# Patient Record
Sex: Male | Born: 1965 | Race: Black or African American | Hispanic: No | Marital: Married | Smoking: Current every day smoker
Health system: Southern US, Community
[De-identification: ages and names within clinical notes are randomized; demographics above are authoritative.]

## PROBLEM LIST (undated history)

## (undated) DIAGNOSIS — E785 Hyperlipidemia, unspecified: Secondary | ICD-10-CM

## (undated) DIAGNOSIS — B20 Human immunodeficiency virus [HIV] disease: Secondary | ICD-10-CM

## (undated) HISTORY — PX: TONSILLECTOMY: SUR1361

## (undated) HISTORY — DX: Human immunodeficiency virus (HIV) disease: B20

## (undated) HISTORY — DX: Hyperlipidemia, unspecified: E78.5

---

## 2007-11-14 ENCOUNTER — Inpatient Hospital Stay (HOSPITAL_COMMUNITY): Admission: EM | Admit: 2007-11-14 | Discharge: 2007-12-13 | Payer: Self-pay | Admitting: Emergency Medicine

## 2007-11-14 ENCOUNTER — Ambulatory Visit: Payer: Self-pay | Admitting: Pulmonary Disease

## 2007-11-15 ENCOUNTER — Encounter: Payer: Self-pay | Admitting: Infectious Diseases

## 2007-11-16 ENCOUNTER — Ambulatory Visit: Payer: Self-pay | Admitting: Infectious Diseases

## 2007-11-17 ENCOUNTER — Encounter: Payer: Self-pay | Admitting: Internal Medicine

## 2007-12-09 ENCOUNTER — Encounter (INDEPENDENT_AMBULATORY_CARE_PROVIDER_SITE_OTHER): Payer: Self-pay | Admitting: *Deleted

## 2007-12-19 ENCOUNTER — Encounter (INDEPENDENT_AMBULATORY_CARE_PROVIDER_SITE_OTHER): Payer: Self-pay | Admitting: *Deleted

## 2008-01-01 ENCOUNTER — Ambulatory Visit: Payer: Self-pay | Admitting: Internal Medicine

## 2008-01-01 DIAGNOSIS — B20 Human immunodeficiency virus [HIV] disease: Secondary | ICD-10-CM

## 2008-01-01 DIAGNOSIS — B59 Pneumocystosis: Secondary | ICD-10-CM | POA: Insufficient documentation

## 2008-01-01 DIAGNOSIS — K029 Dental caries, unspecified: Secondary | ICD-10-CM | POA: Insufficient documentation

## 2008-01-01 HISTORY — DX: Human immunodeficiency virus (HIV) disease: B20

## 2008-01-01 LAB — CONVERTED CEMR LAB: GC Probe Amp, Urine: NEGATIVE

## 2008-01-30 ENCOUNTER — Ambulatory Visit: Payer: Self-pay | Admitting: Internal Medicine

## 2008-01-30 LAB — CONVERTED CEMR LAB
ALT: 91 units/L — ABNORMAL HIGH (ref 0–53)
AST: 60 units/L — ABNORMAL HIGH (ref 0–37)
Alkaline Phosphatase: 89 units/L (ref 39–117)
Basophils Absolute: 0 10*3/uL (ref 0.0–0.1)
CO2: 25 meq/L (ref 19–32)
Eosinophils Absolute: 0.1 10*3/uL (ref 0.0–0.7)
Eosinophils Relative: 2 % (ref 0–5)
HCT: 42.1 % (ref 39.0–52.0)
HIV 1 RNA Quant: 216 copies/mL — ABNORMAL HIGH (ref <48)
HIV-1 RNA Quant, Log: 2.33 — ABNORMAL HIGH (ref <1.68)
LDL Cholesterol: 64 mg/dL (ref 0–99)
Lymphocytes Relative: 43 % (ref 12–46)
Platelets: 275 10*3/uL (ref 150–400)
RDW: 18.6 % — ABNORMAL HIGH (ref 11.5–15.5)
Sodium: 141 meq/L (ref 135–145)
Total Bilirubin: 0.2 mg/dL — ABNORMAL LOW (ref 0.3–1.2)
Total Protein: 7.5 g/dL (ref 6.0–8.3)
VLDL: 60 mg/dL — ABNORMAL HIGH (ref 0–40)

## 2008-02-18 ENCOUNTER — Ambulatory Visit: Payer: Self-pay | Admitting: Internal Medicine

## 2008-02-19 ENCOUNTER — Encounter: Payer: Self-pay | Admitting: Internal Medicine

## 2008-02-20 ENCOUNTER — Encounter: Payer: Self-pay | Admitting: Infectious Diseases

## 2008-03-06 ENCOUNTER — Telehealth: Payer: Self-pay | Admitting: Internal Medicine

## 2008-03-17 ENCOUNTER — Telehealth (INDEPENDENT_AMBULATORY_CARE_PROVIDER_SITE_OTHER): Payer: Self-pay | Admitting: *Deleted

## 2008-04-07 ENCOUNTER — Encounter: Payer: Self-pay | Admitting: Internal Medicine

## 2008-04-27 ENCOUNTER — Telehealth (INDEPENDENT_AMBULATORY_CARE_PROVIDER_SITE_OTHER): Payer: Self-pay | Admitting: *Deleted

## 2008-05-18 ENCOUNTER — Ambulatory Visit: Payer: Self-pay | Admitting: Internal Medicine

## 2008-05-18 LAB — CONVERTED CEMR LAB
ALT: 21 units/L (ref 0–53)
Albumin: 4.2 g/dL (ref 3.5–5.2)
CO2: 18 meq/L — ABNORMAL LOW (ref 19–32)
Calcium: 8.9 mg/dL (ref 8.4–10.5)
Chloride: 106 meq/L (ref 96–112)
Cholesterol: 155 mg/dL (ref 0–200)
Creatinine, Ser: 0.76 mg/dL (ref 0.40–1.50)
Eosinophils Absolute: 0.1 10*3/uL (ref 0.0–0.7)
Eosinophils Relative: 2 % (ref 0–5)
HIV 1 RNA Quant: 64 copies/mL — ABNORMAL HIGH (ref <48)
HIV-1 RNA Quant, Log: 1.81 — ABNORMAL HIGH (ref <1.68)
Hemoglobin: 14.7 g/dL (ref 13.0–17.0)
Lymphocytes Relative: 32 % (ref 12–46)
Lymphs Abs: 2.4 10*3/uL (ref 0.7–4.0)
MCHC: 32.9 g/dL (ref 30.0–36.0)
MCV: 81.7 fL (ref 78.0–100.0)
Monocytes Relative: 6 % (ref 3–12)
Neutrophils Relative %: 60 % (ref 43–77)
Potassium: 3.8 meq/L (ref 3.5–5.3)
RBC: 5.47 M/uL (ref 4.22–5.81)
Total CHOL/HDL Ratio: 3.8

## 2008-05-21 ENCOUNTER — Telehealth (INDEPENDENT_AMBULATORY_CARE_PROVIDER_SITE_OTHER): Payer: Self-pay | Admitting: *Deleted

## 2008-06-02 ENCOUNTER — Ambulatory Visit: Payer: Self-pay | Admitting: Internal Medicine

## 2008-06-02 DIAGNOSIS — F17200 Nicotine dependence, unspecified, uncomplicated: Secondary | ICD-10-CM | POA: Insufficient documentation

## 2008-06-03 ENCOUNTER — Encounter (INDEPENDENT_AMBULATORY_CARE_PROVIDER_SITE_OTHER): Payer: Self-pay | Admitting: *Deleted

## 2008-06-22 ENCOUNTER — Telehealth (INDEPENDENT_AMBULATORY_CARE_PROVIDER_SITE_OTHER): Payer: Self-pay | Admitting: *Deleted

## 2008-07-16 ENCOUNTER — Telehealth (INDEPENDENT_AMBULATORY_CARE_PROVIDER_SITE_OTHER): Payer: Self-pay | Admitting: *Deleted

## 2008-08-13 ENCOUNTER — Telehealth (INDEPENDENT_AMBULATORY_CARE_PROVIDER_SITE_OTHER): Payer: Self-pay | Admitting: *Deleted

## 2008-09-15 ENCOUNTER — Telehealth (INDEPENDENT_AMBULATORY_CARE_PROVIDER_SITE_OTHER): Payer: Self-pay | Admitting: *Deleted

## 2008-10-01 ENCOUNTER — Ambulatory Visit: Payer: Self-pay | Admitting: Internal Medicine

## 2008-10-01 LAB — CONVERTED CEMR LAB
HIV 1 RNA Quant: <48 copies/mL (ref <48)
HIV-1 RNA Quant, Log: <1.68 (ref <1.68)

## 2008-10-12 ENCOUNTER — Telehealth: Payer: Self-pay | Admitting: Internal Medicine

## 2008-11-06 ENCOUNTER — Telehealth (INDEPENDENT_AMBULATORY_CARE_PROVIDER_SITE_OTHER): Payer: Self-pay | Admitting: *Deleted

## 2008-11-10 ENCOUNTER — Ambulatory Visit: Payer: Self-pay | Admitting: Internal Medicine

## 2008-12-09 ENCOUNTER — Telehealth (INDEPENDENT_AMBULATORY_CARE_PROVIDER_SITE_OTHER): Payer: Self-pay | Admitting: *Deleted

## 2009-01-06 ENCOUNTER — Telehealth (INDEPENDENT_AMBULATORY_CARE_PROVIDER_SITE_OTHER): Payer: Self-pay | Admitting: *Deleted

## 2009-02-05 ENCOUNTER — Telehealth (INDEPENDENT_AMBULATORY_CARE_PROVIDER_SITE_OTHER): Payer: Self-pay | Admitting: *Deleted

## 2009-03-18 ENCOUNTER — Telehealth (INDEPENDENT_AMBULATORY_CARE_PROVIDER_SITE_OTHER): Payer: Self-pay | Admitting: *Deleted

## 2009-04-05 ENCOUNTER — Telehealth (INDEPENDENT_AMBULATORY_CARE_PROVIDER_SITE_OTHER): Payer: Self-pay | Admitting: *Deleted

## 2009-04-06 IMAGING — CR DG CHEST 2V
2 series · 2 of 2 positions shown · non-contrast
Comparison: 11/19/2007

CLINICAL DATA: Shortness of breath.  Pneumonia.

CHEST - 2 VIEW

[w chest pa]
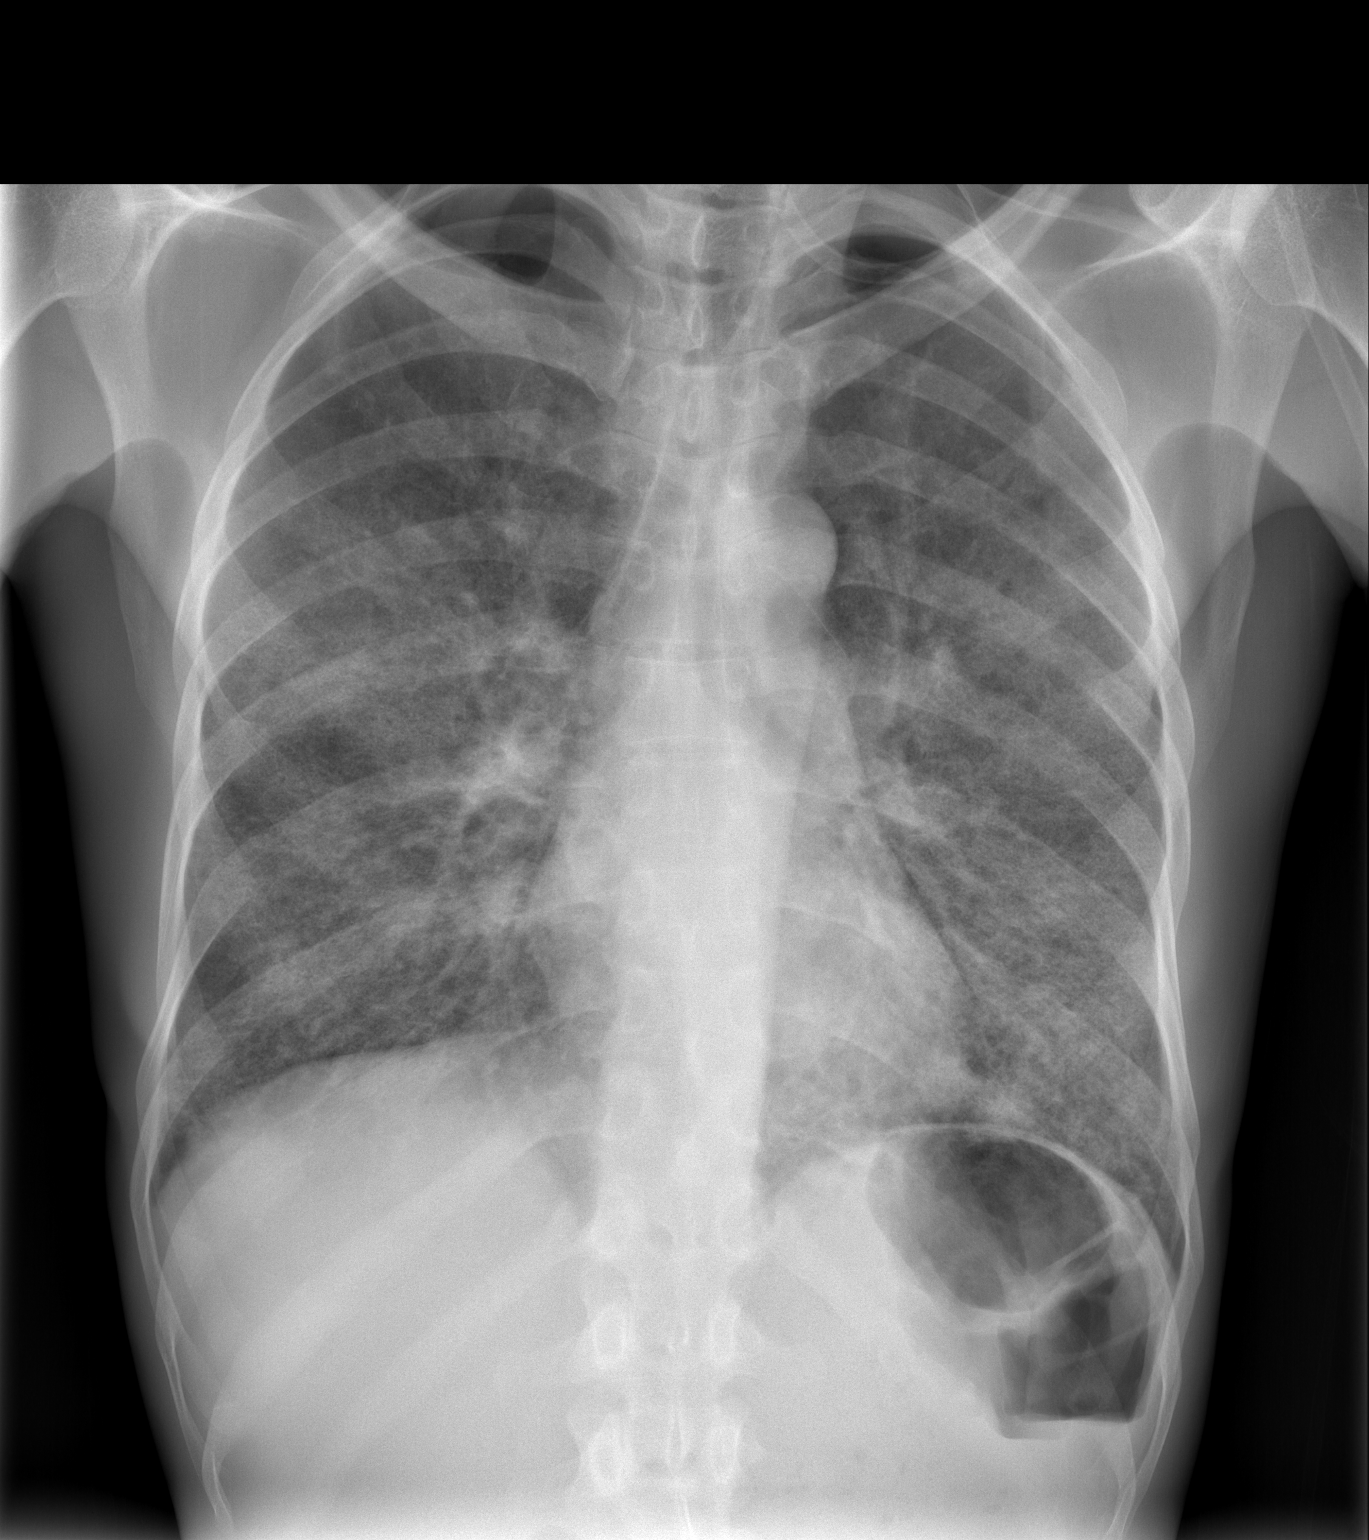

[w chest lat]
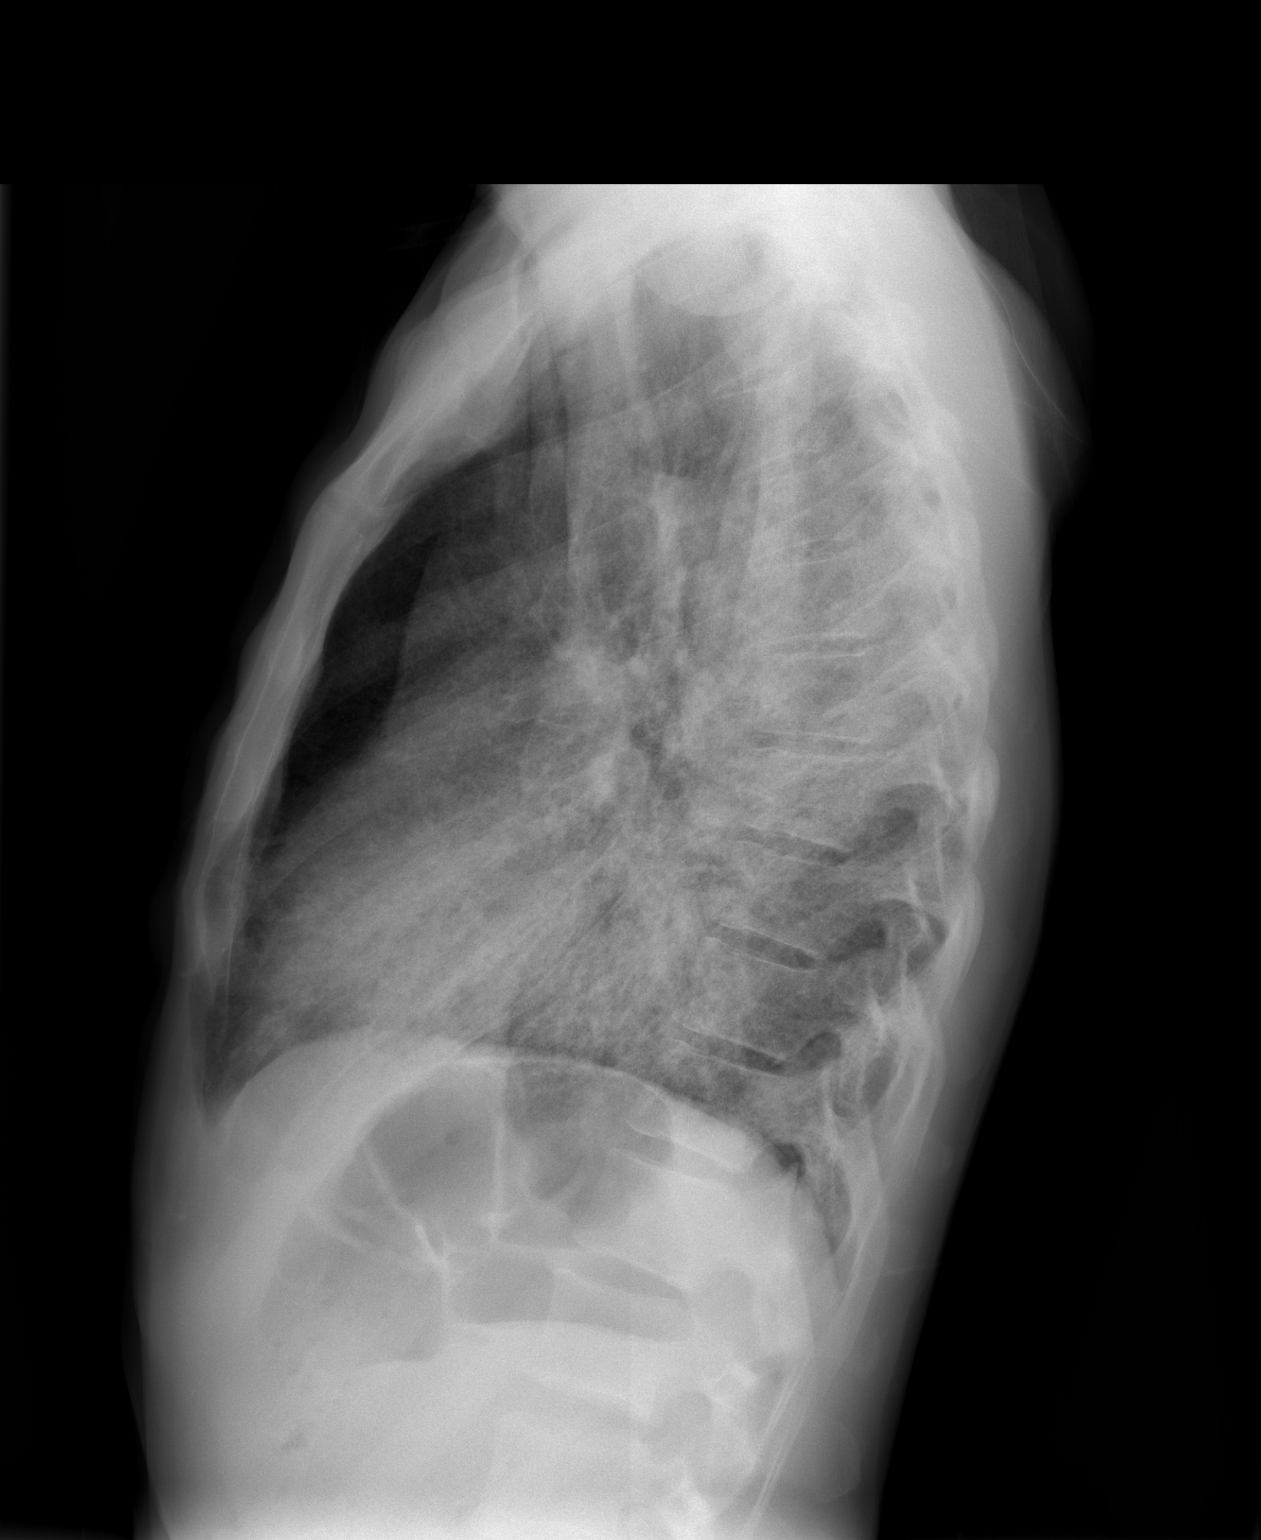

[2 of 2 positions shown; findings below may reference images not displayed]

FINDINGS: Bilateral airspace opacities have slightly improved since
the prior study.
The cardiomediastinal silhouette is unremarkable.
No evidence of pleural effusions or pneumothorax noted.
No acute bony abnormalities are present.
IMPRESSION: Slight improvement of diffuse bilateral airspace opacities.

## 2009-04-07 ENCOUNTER — Ambulatory Visit: Payer: Self-pay | Admitting: Internal Medicine

## 2009-04-07 LAB — CONVERTED CEMR LAB
ALT: 16 units/L (ref 0–53)
AST: 19 units/L (ref 0–37)
Albumin: 4.4 g/dL (ref 3.5–5.2)
Alkaline Phosphatase: 108 units/L (ref 39–117)
BUN: 12 mg/dL (ref 6–23)
Basophils Absolute: 0 10*3/uL (ref 0.0–0.1)
Calcium: 9.3 mg/dL (ref 8.4–10.5)
Chloride: 108 meq/L (ref 96–112)
HCT: 44.6 % (ref 39.0–52.0)
HDL: 47 mg/dL (ref >39)
HIV 1 RNA Quant: 123 copies/mL — ABNORMAL HIGH (ref <48)
LDL Cholesterol: 90 mg/dL (ref 0–99)
Lymphocytes Relative: 48 % — ABNORMAL HIGH (ref 12–46)
Lymphs Abs: 2.9 10*3/uL (ref 0.7–4.0)
Neutro Abs: 2.5 10*3/uL (ref 1.7–7.7)
Neutrophils Relative %: 41 % — ABNORMAL LOW (ref 43–77)
Platelets: 195 10*3/uL (ref 150–400)
Potassium: 3.7 meq/L (ref 3.5–5.3)
RDW: 15 % (ref 11.5–15.5)
Sodium: 140 meq/L (ref 135–145)
WBC: 5.9 10*3/uL (ref 4.0–10.5)

## 2009-04-22 ENCOUNTER — Ambulatory Visit: Payer: Self-pay | Admitting: Internal Medicine

## 2009-04-22 IMAGING — CR DG CHEST 2V
2 series · 2 of 2 positions shown · non-contrast
Comparison: None [DATE] and 11/14/2007

CLINICAL DATA: Pneumonia, hypoxia.

CHEST - 2 VIEW

[w chest pa]
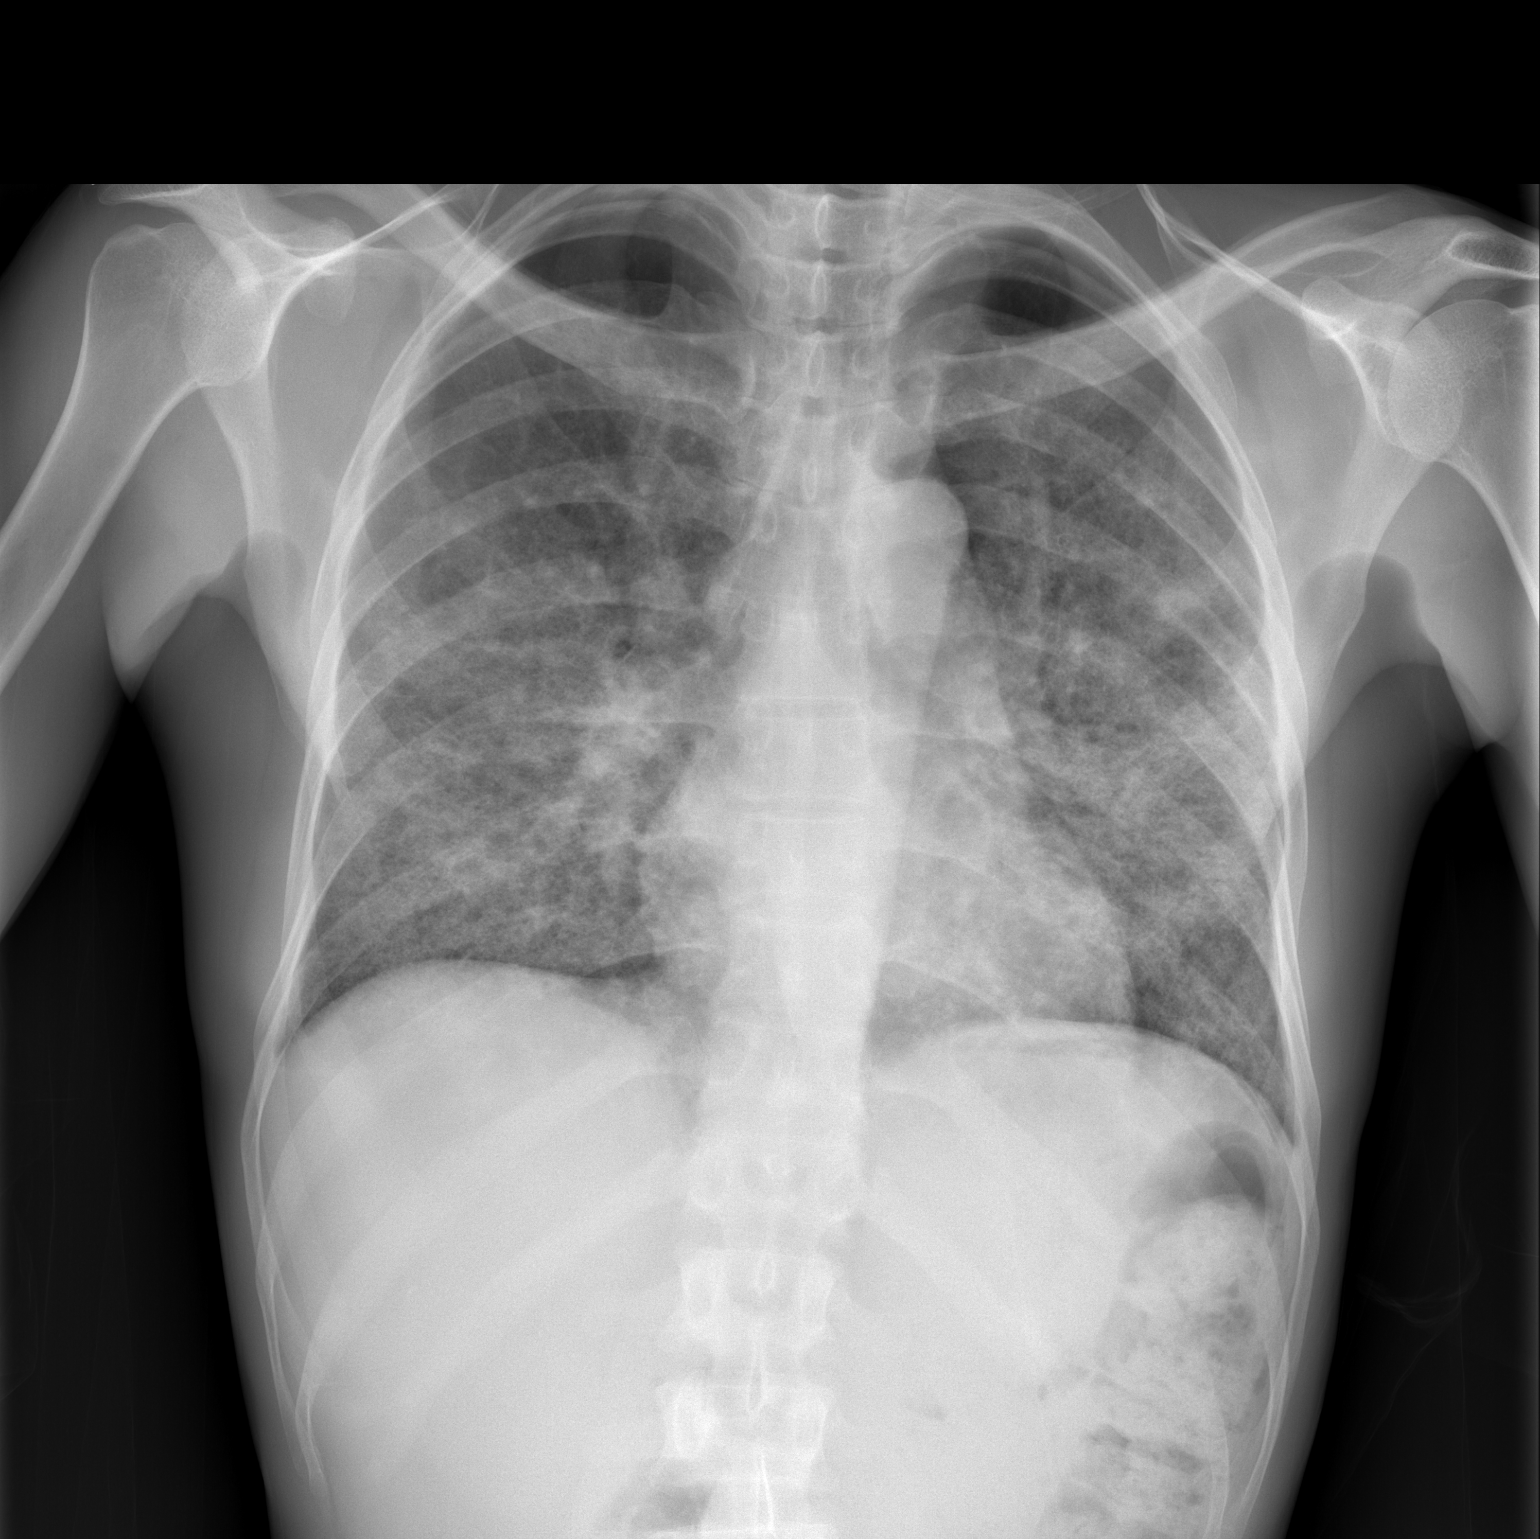

[w chest lat]
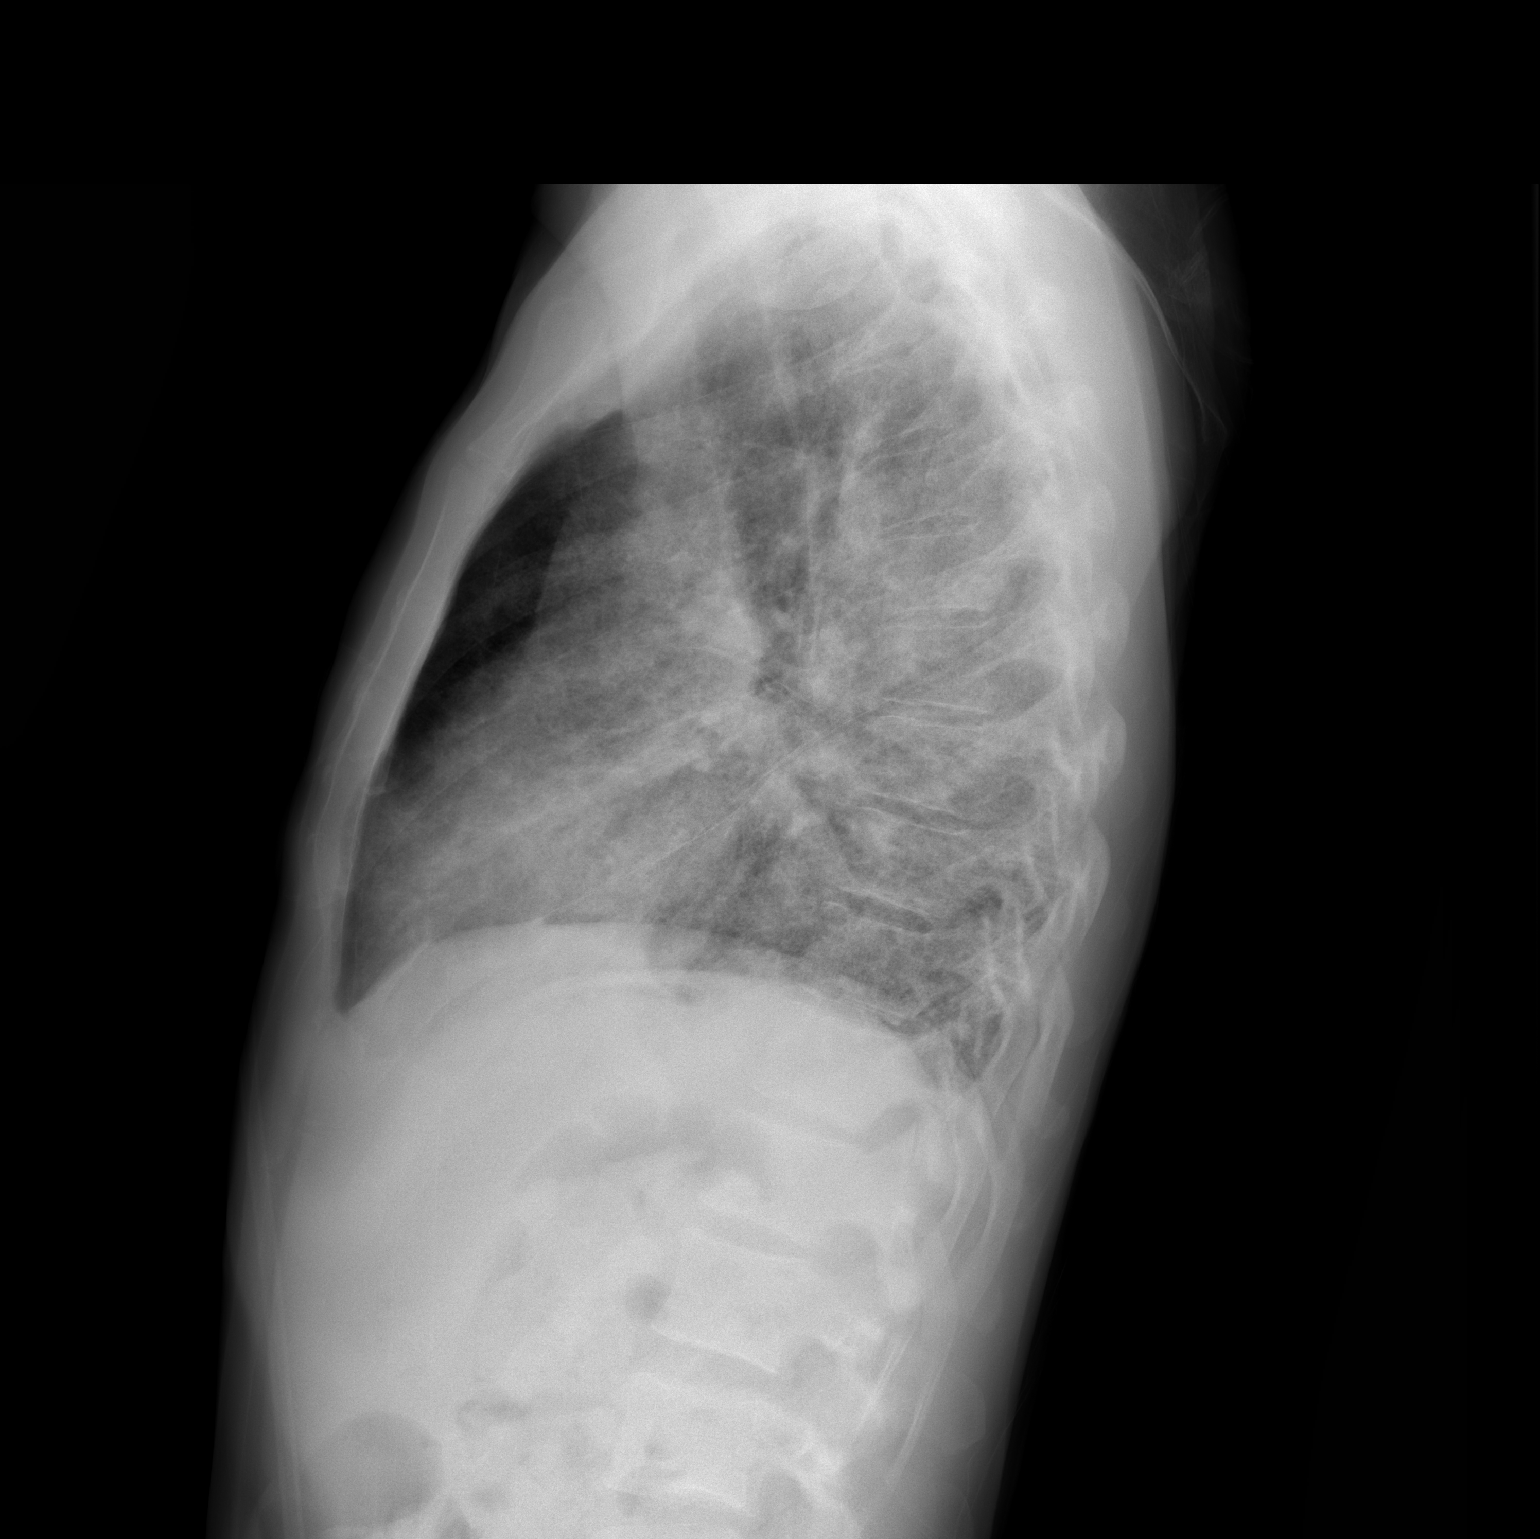

[2 of 2 positions shown; findings below may reference images not displayed]

FINDINGS: Trachea is midline.  Heart size normal.  Patchy bilateral
air space disease persists.  No pleural fluid.
IMPRESSION: Persistent bilateral air space disease, without interval change
over several prior exams.  CT chest may be helpful in further
evaluation, as clinically indicated.

## 2009-05-04 ENCOUNTER — Telehealth (INDEPENDENT_AMBULATORY_CARE_PROVIDER_SITE_OTHER): Payer: Self-pay | Admitting: *Deleted

## 2009-05-31 ENCOUNTER — Telehealth (INDEPENDENT_AMBULATORY_CARE_PROVIDER_SITE_OTHER): Payer: Self-pay | Admitting: *Deleted

## 2009-06-30 ENCOUNTER — Telehealth (INDEPENDENT_AMBULATORY_CARE_PROVIDER_SITE_OTHER): Payer: Self-pay | Admitting: *Deleted

## 2009-07-23 ENCOUNTER — Telehealth (INDEPENDENT_AMBULATORY_CARE_PROVIDER_SITE_OTHER): Payer: Self-pay | Admitting: *Deleted

## 2009-11-08 ENCOUNTER — Telehealth (INDEPENDENT_AMBULATORY_CARE_PROVIDER_SITE_OTHER): Payer: Self-pay | Admitting: *Deleted

## 2009-11-08 ENCOUNTER — Ambulatory Visit: Payer: Self-pay | Admitting: Internal Medicine

## 2009-11-08 LAB — CONVERTED CEMR LAB
HIV 1 RNA Quant: <48 copies/mL (ref <48)
HIV-1 RNA Quant, Log: <1.68 (ref <1.68)

## 2009-11-09 ENCOUNTER — Telehealth (INDEPENDENT_AMBULATORY_CARE_PROVIDER_SITE_OTHER): Payer: Self-pay | Admitting: *Deleted

## 2009-11-23 ENCOUNTER — Ambulatory Visit: Payer: Self-pay | Admitting: Internal Medicine

## 2009-12-31 ENCOUNTER — Telehealth (INDEPENDENT_AMBULATORY_CARE_PROVIDER_SITE_OTHER): Payer: Self-pay | Admitting: *Deleted

## 2010-01-04 ENCOUNTER — Telehealth (INDEPENDENT_AMBULATORY_CARE_PROVIDER_SITE_OTHER): Payer: Self-pay | Admitting: *Deleted

## 2010-01-11 ENCOUNTER — Encounter: Payer: Self-pay | Admitting: Internal Medicine

## 2010-02-03 ENCOUNTER — Telehealth (INDEPENDENT_AMBULATORY_CARE_PROVIDER_SITE_OTHER): Payer: Self-pay | Admitting: *Deleted

## 2010-02-15 ENCOUNTER — Encounter (INDEPENDENT_AMBULATORY_CARE_PROVIDER_SITE_OTHER): Payer: Self-pay | Admitting: *Deleted

## 2010-04-19 NOTE — Progress Notes (Signed)
Summary: Pt. assist meds arrived for Jan via [facility] mail order  Phone Note Refill Request      Prescriptions: SEPTRA DS 800-160 MG TABS (SULFAMETHOXAZOLE-TRIMETHOPRIM) Take 1 tablet by mouth once a day  #30 x 0   Entered by:   Paulo Fruit  BS,CPht II,MPH   Authorized by:   Cliffton Asters MD   Signed by:   Paulo Fruit  BS,CPht II,MPH on 04/05/2009   Method used:   Samples Given   RxID:   [id number] ATRIPLA 600-200-300 MG TABS (EFAVIRENZ-EMTRICITAB-TENOFOVIR) Take 1 tablet by mouth once a day  #30 x 0   Entered by:   Paulo Fruit  BS,CPht II,MPH   Authorized by:   Cliffton Asters MD   Signed by:   Paulo Fruit  BS,CPht II,MPH on 04/05/2009   Method used:   Samples Given   RxID:   [id number]  Patient Assist Medication Verification: Medication name: SMZ-TMP DS 800/160mg  RX #  [id number] Tech approval:MLD   Patient Assist Medication Verification: Medication:Atripla Lot# [id number] Exp Date:06 2013 Tech approval:MLD Call placed to patient with message that assistance medications are ready for pick-up. Paulo Fruit  BS,CPht II,MPH  April 05, 2009 9:23 AM                  Appended Document: Pt. assist meds arrived for Jan via [facility] mail order Prescription/Samples picked up by: patient

## 2010-04-19 NOTE — Progress Notes (Signed)
Summary: meds arrived via mail [facility]  Phone Note Refill Request      Prescriptions: ATRIPLA 600-200-300 MG TABS (EFAVIRENZ-EMTRICITAB-TENOFOVIR) Take 1 tablet by mouth once a day  #30 x 0   Entered by:   Paulo Fruit  BS,CPht II,MPH   Authorized by:   Cliffton Asters MD   Signed by:   Paulo Fruit  BS,CPht II,MPH on 11/09/2009   Method used:   Samples Given   RxID:   [id number]  Patient Assist Medication Verification: Medication name: Atripla RX # [id number] Tech approval:MLD Call placed to patient with message that assistance medications are ready for pick-up. Paulo Fruit  BS,CPht II,MPH  November 09, 2009 3:29 PM

## 2010-04-19 NOTE — Progress Notes (Signed)
Summary: Pt assist med arrived via [facility] for Mar  Phone Note Refill Request      Prescriptions: ATRIPLA 600-200-300 MG TABS (EFAVIRENZ-EMTRICITAB-TENOFOVIR) Take 1 tablet by mouth once a day  #30 x 0   Entered by:   Paulo Fruit  BS,CPht II,MPH   Authorized by:   Cliffton Asters MD   Signed by:   Paulo Fruit  BS,CPht II,MPH on 05/31/2009   Method used:   Samples Given   RxID:   [id number]   Patient Assist Medication Verification: Medication: Atripla FAO#[id number] Exp Date:07 2013 Tech approval:MLD Call placed to patient with message that assistance medications are ready for pick-up. Paulo Fruit  BS,CPht II,MPH  May 31, 2009 3:31 PM

## 2010-04-19 NOTE — Progress Notes (Signed)
Summary: [facility]/pt assist med arrived for May  Phone Note Refill Request      Prescriptions: ATRIPLA 600-200-300 MG TABS (EFAVIRENZ-EMTRICITAB-TENOFOVIR) Take 1 tablet by mouth once a day  #30 x 0   Entered by:   Paulo Fruit  BS,CPht II,MPH   Authorized by:   Cliffton Asters MD   Signed by:   Paulo Fruit  BS,CPht II,MPH on 07/23/2009   Method used:   Samples Given   RxID:   [id number]   Patient Assist Medication Verification: Medication: Atripla Lot# [id number] Exp Date:08 2013 Tech approval:MLD Call placed to patient with message that assistance medications are ready for pick-up. Paulo Fruit  BS,CPht II,MPH  Jul 23, 2009 10:03 AM

## 2010-04-19 NOTE — Miscellaneous (Signed)
Summary: Orders Update  Clinical Lists Changes  Orders: Added new Referral order of Misc. Referral (Misc. Ref) - Signed 

## 2010-04-19 NOTE — Progress Notes (Signed)
Summary: Atripla   Phone Note Outgoing Call   Summary of Call: Atriplla Medication(s) have arrived.  Left message or spoke to patient advising them they were ready for pickup.   Initial call taken by: Altamease Oiler,  December 31, 2009 8:36 AM

## 2010-04-19 NOTE — Progress Notes (Signed)
Summary: Pt assist med arrived via [facility] for Apr  Phone Note Refill Request      Prescriptions: ATRIPLA 600-200-300 MG TABS (EFAVIRENZ-EMTRICITAB-TENOFOVIR) Take 1 tablet by mouth once a day  #30 x 0   Entered by:   Paulo Fruit  BS,CPht II,MPH   Authorized by:   Cliffton Asters MD   Signed by:   Paulo Fruit  BS,CPht II,MPH on 06/30/2009   Method used:   Samples Given   RxID:   [id number]   Patient Assist Medication Verification: Medication: William [name] Lot# [id number] Exp Date:07 2013 Tech approval:MLD Call placed to patient with message that assistance medications are ready for pick-up. Paulo Fruit  BS,CPht II,MPH  June 30, 2009 4:45 PM

## 2010-04-19 NOTE — Assessment & Plan Note (Signed)
Summary: 6 MONTH RECHECK/CH   CC:  follow-up visit.  History of Present Illness: William [name] is in for his routine visit.  He has no trouble obtaining or tolerating his medications and denies missing a single dose of Atripla. He says he has been feeling much better over the last year.  He is not in a relationship and has not been sexually active.  Preventive Screening-Counseling & Management  Alcohol-Tobacco     Alcohol drinks/day: 0     Smoking Status: current     Smoking Cessation Counseling: yes     Smoke Cessation Stage: contemplative     Packs/Day: 0.5     Year Started: 30yrs.     Year Quit: 2009     Passive Smoke Exposure: no  Caffeine-Diet-Exercise     Caffeine use/day: YES     Does Patient Exercise: no     Type of exercise: walking     Exercise (avg: min/session): 30-60     Times/week: 5  Hep-HIV-STD-Contraception     HIV Risk: no risk noted  Safety-Violence-Falls     Seat Belt Use: yes  Comments: declined condoms      Sexual History:  n/a.        Drug Use:  never.     Prior Medication List:  ATRIPLA 600-200-300 MG TABS (EFAVIRENZ-EMTRICITAB-TENOFOVIR) Take 1 tablet by mouth once a day SEPTRA DS 800-160 MG TABS (SULFAMETHOXAZOLE-TRIMETHOPRIM) Take 1 tablet by mouth once a day   Current Allergies (reviewed today): No known allergies  Social History: Sexual History:  n/a  Vital Signs:  Patient profile:   45 year old male Height:      71 inches (180.34 cm) Weight:      147.3 pounds (66.95 kg) BMI:     20.62 Temp:     97.0 degrees F (36.11 degrees C) oral Pulse rate:   65 / minute BP sitting:   120 / 79  (left arm) Cuff size:   regular  Vitals Entered By: Jennet Maduro RN (April 22, 2009 10:49 AM) CC: follow-up visit Is Patient Diabetic? No Pain Assessment Patient in pain? no      Nutritional Status BMI of 19 -24 = normal Nutritional Status Detail appetite "GOOD"  Have you ever been in a relationship where you felt threatened, hurt or  afraid?No   Does patient need assistance? Functional Status Self care Ambulation Normal Comments no missed doses of rxes   Physical Exam  General:  alert and well-nourished.   Mouth:  pharynx pink and moist, no erythema, no exudates, and fair dentition.   Lungs:  normal breath sounds.  no crackles and no wheezes.   Heart:  normal rate, regular rhythm, and no murmur.   Skin:  no rashes.   Psych:  normally interactive, good eye contact, not anxious appearing, and not depressed appearing.          Medication Adherence: 04/22/2009   Adherence to medications reviewed with patient. Counseling to provide adequate adherence provided   Prevention For Positives: 04/22/2009   Safe sex practices discussed with patient. Condoms offered.                             Impression & Recommendations:  Problem # 1:  HIV DISEASE (ICD-042) [name]'s HIV infection is under much better control over the last year and a half.  I will not make any changes today other than stopping his PCP prophylaxis. The following medications were  removed from the medication list:    Septra Ds 800-160 Mg Tabs (Sulfamethoxazole-trimethoprim) .Marland Kitchen... 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 Patient Level III (65784) Future Orders: T-CD4SP ([facility]) (CD4SP) ... 10/19/2009 T-HIV Viral Load [phone] ... 10/19/2009  Patient Instructions: 1)  Please schedule a follow-up appointment in 6 months.   Process Orders Check Orders Results:     [facility]: ABN not required for this insurance Tests Sent for requisitioning (April 22, 2009 11:03 AM):     10/19/2009: [facility] -- T-HIV Viral Load  [phone] (signed)         Medication Adherence: 04/22/2009   Adherence to medications reviewed with patient. Counseling to provide adequate adherence provided    Prevention For Positives: 04/22/2009   Safe sex practices discussed with patient. Condoms offered.                            Appended Document: vaccines Flu Vaccine Consent Questions     Do you have a history of severe allergic reactions to this vaccine? no    Any prior history of allergic reactions to egg and/or gelatin? no    Do you have a sensitivity to the preservative Thimersol? no    Do you have a past history of Guillan-Barre Syndrome? no    Do you currently have an acute febrile illness? no    Have you ever had a severe reaction to latex? no    Vaccine information given and explained to patient? yes    Are you currently pregnant? no    Lot [id number]   Exp Date:04//2011   Manufacturer: [employer]    Site Given  Left Deltoid IM Jennet Maduro RN  April 22, 2009 11:09 AM   Hepatitis B Vaccine # 3    Vaccine Type: HepB Adult    Site: right deltoid    Mfr: Merck    Dose: 0.5 ml    Route: IM    Given by: Jennet Maduro RN    Exp. Date: 02/20/2011    Lot #: [id number]

## 2010-04-19 NOTE — Progress Notes (Signed)
Summary: Pt assist med arrivef via [facility] for Feb  Phone Note Refill Request      Prescriptions: ATRIPLA 600-200-300 MG TABS (EFAVIRENZ-EMTRICITAB-TENOFOVIR) Take 1 tablet by mouth once a day  #30 x 0   Entered by:   Paulo Fruit  BS,CPht II,MPH   Authorized by:   Cliffton Asters MD   Signed by:   Paulo Fruit  BS,CPht II,MPH on 05/04/2009   Method used:   Samples Given   RxID:   [id number]   Patient Assist Medication Verification: Medication:  Atripla Lot# [id number] Exp Date:06 2012 Tech approval:MLD Call placed to patient with message that assistance medications are ready for pick-up. Paulo Fruit  BS,CPht II,MPH  May 04, 2009 4:46 PM

## 2010-04-19 NOTE — Progress Notes (Signed)
Summary: new mail order pharmacy  Phone Note Call from Patient   Caller: Patient  labs Summary of Call: Patient came to pick up medications while at lab appt.  Sadly to say, I did not have any medication to give him.  I called to see if he was transfered to another pharmacy. I spoke to someone at [facility] who did state that he was transfered to [facility] in  [place] through the buy out.  They have not filled anything since May.  But partient had medications here for months so he was not out.  I called [facility] and they had patient in system with no prescriptions.  I gave the pharmacy a new prescription for patient and we will receive his medication on Tuesday, 11/09/09.  Pt. aware to come after 11am Initial call taken by: Paulo Fruit  BS,CPht II,MPH,  November 08, 2009 9:19 AM    Prescriptions: ATRIPLA 600-200-300 MG TABS (EFAVIRENZ-EMTRICITAB-TENOFOVIR) Take 1 tablet by mouth once a day  #30 x 11   Entered by:   Paulo Fruit  BS,CPht II,MPH   Authorized by:   Cliffton Asters MD   Signed by:   Paulo Fruit  BS,CPht II,MPH on 11/08/2009   Method used:   Telephoned to ...         RxID:   [id number]  spoke to RPh who read back the order. [phone] Paulo Fruit  BS,CPht II,MPH  November 08, 2009 9:21 AM

## 2010-04-19 NOTE — Progress Notes (Signed)
Summary: Pt. called to pick-up rx  Phone Note Outgoing Call   Call placed by: Jennet Maduro RN,  February 03, 2010 1:23 PM Call placed to: Patient Action Taken: Assistance medications ready for pick up Summary of Call: [facility] Atripla rx received.  Pt informed to pick up rx. Jennet Maduro RN  February 03, 2010 1:25 PM     New/Updated Medications: ATRIPLA 600-200-300 MG TABS (EFAVIRENZ-EMTRICITAB-TENOFOVIR) Take 1 tablet by mouth at bedtime daily Prescriptions: ATRIPLA 600-200-300 MG TABS (EFAVIRENZ-EMTRICITAB-TENOFOVIR) Take 1 tablet by mouth at bedtime daily  #30 x 11   Entered by:   Jennet Maduro RN   Authorized by:   Cliffton Asters MD   Signed by:   Jennet Maduro RN on 02/03/2010   Method used:   [phone]       [address]       [place], [place]  [address]       Ph: [phone]       Fax: [phone]   RxID:   [id number]  [facility]. Jennet Maduro RN  February 03, 2010 1:30 PM

## 2010-04-19 NOTE — Progress Notes (Signed)
Summary: Patient picked up medicine  Phone Note Other Incoming   Caller: Patient Summary of Call: Patient came in to pick up medicine on 01-04-10.

## 2010-04-19 NOTE — Miscellaneous (Signed)
  Clinical Lists Changes 

## 2010-04-19 NOTE — Assessment & Plan Note (Signed)
Summary: F/U [MKJ]   CC:  follow-up visit.  History of Present Illness: William [name] is in for his routine visit.  He does not recall missing a single dose of his Atripla. He is not in any relationship currently and has not been sexually active.  Preventive Screening-Counseling & Management  Alcohol-Tobacco     Alcohol drinks/day: 0     Smoking Status: current     Smoking Cessation Counseling: yes     Smoke Cessation Stage: contemplative     Packs/Day: 0.5     Year Started: 46yrs.     Year Quit: 2009     Passive Smoke Exposure: no  Caffeine-Diet-Exercise     Caffeine use/day: YES     Does Patient Exercise: yes     Type of exercise: walking     Exercise (avg: min/session): 30-60     Times/week: 3  Hep-HIV-STD-Contraception     HIV Risk: no risk noted  Safety-Violence-Falls     Seat Belt Use: yes  Comments: declined condoms      Sexual History:  n/a.        Drug Use:  former.     Prior Medication List:  ATRIPLA 600-200-300 MG TABS (EFAVIRENZ-EMTRICITAB-TENOFOVIR) Take 1 tablet by mouth once a day   Current Allergies (reviewed today): No known allergies  Social History: Drug Use:  former  Vital Signs:  Patient profile:   45 year old male Height:      71 inches (180.34 cm) Weight:      149.5 pounds (67.95 kg) BMI:     20.93 Temp:     98.2 degrees F (36.78 degrees C) oral Pulse rate:   77 / minute BP sitting:   122 / 77  (left arm) Cuff size:   regular  Vitals Entered By: Jennet Maduro RN (November 23, 2009 8:55 AM) CC: follow-up visit Is Patient Diabetic? No Pain Assessment Patient in pain? no      Nutritional Status BMI of 19 -24 = normal Nutritional Status Detail appetite "fine"  Have you ever been in a relationship where you felt threatened, hurt or afraid?No   Does patient need assistance? Functional Status Self care Ambulation Normal Comments no missed doses of rxes   Physical Exam  General:  alert and well-nourished.   Mouth:   pharynx pink and moist, no erythema, no exudates, and fair dentition.   Lungs:  normal breath sounds.  no crackles and no wheezes.   Heart:  normal rate, regular rhythm, and no murmur.           Medication Adherence: 11/23/2009   Adherence to medications reviewed with patient. Counseling to provide adequate adherence provided   Prevention For Positives: 11/23/2009   Safe sex practices discussed with patient. Condoms offered.                             Impression & Recommendations:  Problem # 1:  HIV DISEASE (ICD-042) Cannen's HIV infection has come under much better control over the past two years due to his excellent adherence.  I will not make any changes today. 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 Patient Level III (54098) Future Orders: T-CD4SP (  [facility]) (CD4SP) ... 05/22/2010 T-HIV Viral Load [phone]) ... 05/22/2010 T-Comprehensive Metabolic Panel [phone]) ... 05/22/2010 T-CBC w/Diff ([id number]) ... 05/22/2010 T-RPR (Syphilis) [phone]) ... 05/22/2010 T-Lipid Profile [id number]) ... 05/22/2010  Patient Instructions: 1)  Please schedule a follow-up appointment in 6 months.  Prevention & Chronic Care Immunizations   Influenza vaccine: Fluvax 3+  (04/22/2009)    Tetanus booster: Not documented    Pneumococcal vaccine: Historical  (11/15/2007)  Other Screening   Smoking status: current  (11/23/2009)   Smoking cessation counseling: yes  (11/23/2009)  Lipids   Total Cholesterol: 157  (04/07/2009)   LDL: 90  (04/07/2009)   LDL Direct: Not documented   HDL: 47  (04/07/2009)   Triglycerides: 102  (04/07/2009)   Appended Document: F/U [MKJ]   Influenza Vaccine     Vaccine Type: Fluvax Non-MCR    Site: left deltoid    Mfr: novartis    Dose: 0.5 ml    Route: IM    Given by: Jennet Maduro RN    Exp. Date: 06/19/2010    Lot #: [id number]  Flu Vaccine Consent Questions    Do you have a history of severe allergic reactions to this vaccine? no    Any prior history of allergic reactions to egg and/or gelatin? no    Do you have a sensitivity to the preservative Thimersol? no    Do you have a past history of Guillan-Barre Syndrome? no    Do you currently have an acute febrile illness? no    Have you ever had a severe reaction to latex? no    Vaccine information given and explained to patient? yes

## 2010-04-22 ENCOUNTER — Encounter (INDEPENDENT_AMBULATORY_CARE_PROVIDER_SITE_OTHER): Payer: Self-pay | Admitting: *Deleted

## 2010-05-05 NOTE — Miscellaneous (Signed)
Summary: RW update  Clinical Lists Changes 

## 2010-06-05 LAB — T-HELPER CELL (CD4) - (RCID CLINIC ONLY)
CD4 % Helper T Cell: 9 % — ABNORMAL LOW (ref 33–55)
CD4 T Cell Abs: 240 uL — ABNORMAL LOW (ref 400–2700)

## 2010-06-06 ENCOUNTER — Other Ambulatory Visit: Payer: Self-pay | Admitting: Internal Medicine

## 2010-06-06 ENCOUNTER — Encounter: Payer: Self-pay | Admitting: Internal Medicine

## 2010-06-06 ENCOUNTER — Other Ambulatory Visit (INDEPENDENT_AMBULATORY_CARE_PROVIDER_SITE_OTHER): Payer: Medicaid Other

## 2010-06-06 DIAGNOSIS — B20 Human immunodeficiency virus [HIV] disease: Secondary | ICD-10-CM

## 2010-06-06 LAB — CONVERTED CEMR LAB
ALT: 15 U/L
AST: 18 U/L
Albumin: 4 g/dL
Alkaline Phosphatase: 90 U/L
BUN: 13 mg/dL
Basophils Absolute: 0 K/uL
Basophils Relative: 1 %
CO2: 25 meq/L
Calcium: 9 mg/dL
Chloride: 104 meq/L
Cholesterol: 141 mg/dL
Creatinine, Ser: 0.9 mg/dL
Eosinophils Absolute: 0.2 K/uL
Eosinophils Relative: 3 %
Glucose, Bld: 67 mg/dL — ABNORMAL LOW
HCT: 47.6 %
HDL: 45 mg/dL
Hemoglobin: 15.5 g/dL
LDL Cholesterol: 76 mg/dL
Lymphocytes Relative: 35 %
Lymphs Abs: 2 K/uL
MCHC: 32.6 g/dL
MCV: 80.1 fL
Monocytes Absolute: 0.7 K/uL
Monocytes Relative: 12 %
Neutro Abs: 2.9 K/uL
Neutrophils Relative %: 50 %
Platelets: 173 K/uL
Potassium: 4.2 meq/L
RBC: 5.94 M/uL — ABNORMAL HIGH
RDW: 14.8 %
Sodium: 140 meq/L
Total Bilirubin: 0.3 mg/dL
Total CHOL/HDL Ratio: 3.1
Total Protein: 6.7 g/dL
Triglycerides: 98 mg/dL
VLDL: 20 mg/dL
WBC: 5.8 10*3/microliter

## 2010-06-07 LAB — T-HELPER CELL (CD4) - (RCID CLINIC ONLY): CD4 % Helper T Cell: 10 % — ABNORMAL LOW (ref 33–55)

## 2010-06-08 LAB — HIV-1 RNA QUANT-NO REFLEX-BLD
HIV 1 RNA Quant: <20 copies/mL (ref <20)
HIV-1 RNA Quant, Log: <1.3 {Log} (ref <1.30)

## 2010-06-21 ENCOUNTER — Ambulatory Visit (INDEPENDENT_AMBULATORY_CARE_PROVIDER_SITE_OTHER): Payer: Medicaid Other | Admitting: Internal Medicine

## 2010-06-21 ENCOUNTER — Encounter: Payer: Self-pay | Admitting: Internal Medicine

## 2010-06-21 VITALS — BP 115/76 | HR 62 | Temp 97.9°F | Ht 71.0 in | Wt 143.0 lb

## 2010-06-21 DIAGNOSIS — Z21 Asymptomatic human immunodeficiency virus [HIV] infection status: Secondary | ICD-10-CM

## 2010-06-21 DIAGNOSIS — B20 Human immunodeficiency virus [HIV] disease: Secondary | ICD-10-CM

## 2010-06-21 NOTE — Progress Notes (Signed)
  Subjective:    Patient ID: William [name], male    DOB: 1965/04/02, 45 y.o.   MRN: [id number]  HPI William [name] is in for his routine visit. He has not had any problems obtaining or tolerating his Atripla. He does not believe he is missed a single dose since his last visit.   Review of Systems     Objective:   Physical Exam  Constitutional: He appears well-developed and well-nourished.  HENT:  Mouth/Throat: No oropharyngeal exudate.  Cardiovascular: Normal rate and regular rhythm.   No murmur heard. Pulmonary/Chest: Effort normal and breath sounds normal. No respiratory distress. He has no wheezes.  Psychiatric: He has a normal mood and affect.          Assessment & Plan:

## 2010-06-21 NOTE — Assessment & Plan Note (Signed)
William [name] adherence is excellent and as a result his HIV infection is under very good control. His bar load is less than 20. A CD4 count is only 210 but I do expect more reconstitution in the coming years.

## 2010-08-02 NOTE — H&P (Signed)
William [name], William [name] NO.:  [id number]   MEDICAL RECORD NO.:  [id number]          PATIENT TYPE:  EMS   LOCATION:  ED                           FACILITY:  [facility]   PHYSICIAN:  Ladell Pier, M.D.   DATE OF BIRTH:  1965/03/26   DATE OF ADMISSION:  11/14/2007  DATE OF DISCHARGE:                              HISTORY & PHYSICAL   CHIEF COMPLAINT:  Shortness of breath.   HISTORY OF PRESENT ILLNESS:  The patient is a 45 year old gentleman that  this at a shelter.  He complains that he has been progressively getting  short of breath over the past 2 weeks or so, but this morning got severe  so he came to the emergency room.  He has had no fevers.  He complains  of chills and a cough productive of white mucus.  He has never had  pneumonia in the past.  No history of any other disease.   PAST MEDICAL HISTORY:  None.   FAMILY HISTORY:  His mother is healthy.  Father died of lung cancer from  smoking.   SOCIAL HISTORY:  He smokes about 1 pack per day.  He denies alcohol and  IV drug use.  He is married with three children.  He is presently  homeless and staying at a shelter.   MEDICATIONS:  None.   ALLERGIES:  NONE.   REVIEW OF SYSTEMS:  As per stated in HPI.   PHYSICAL EXAMINATION:  VITAL SIGNS:  Temperature 97.6, pulse of 80,  respirations 20.  Pulse ox 88% on room air.  GENERAL:  The patient is cachectic looking, laying on the stretcher in  no acute distress.  HEENT:  Normocephalic, atraumatic.  Pupils reactive to light.  Poor  dentition.  LUNGS:  Clear bilaterally.  No wheeze, rhonchi or rales.  CARDIOVASCULAR:  Regular rate and rhythm.  No murmurs, rubs or gallops.  ABDOMEN:  Soft, nontender, nondistended.  Positive bowel sounds.  EXTREMITIES:  No edema.  2+ pedal pulses bilaterally.  NEURO:  Nonfocal.   LABORATORY DATA:  Myoglobin 115, MB 1.1, troponin less than 0.05.  WBC  7.8, hemoglobin 13.3, platelets 309, LDH elevated at 624.  Sodium 139,  potassium  4.1, chloride 106, CO2 of 21, glucose 106, BUN 20, creatinine  0.91, albumin 2.2, total protein 7.5, calcium 8.7.  Chest x-ray showed  question of viral pneumonia, versus rheumatoid lung, versus pulmonary  fibrosis.   ASSESSMENT/PLAN:  1. Question hypoxia, question of whether a viral pneumonia.  2. Homeless.  3. Tobacco use.   Will admit the patient to the hospital.  Start on Rocephin, Zithromax  and Tamiflu.  Check H1N1, blood cultures, sputum cultures, urine for  Legionella and monitor the patient on O2.      Ladell Pier, M.D.  Electronically Signed     NJ/MEDQ  D:  11/14/2007  T:  11/14/2007  Job:  [id number]

## 2010-08-02 NOTE — Group Therapy Note (Signed)
William [name], RUETH NO.:  [id number]   MEDICAL RECORD NO.:  [id number]          PATIENT TYPE:  INP   LOCATION:  1516                         FACILITY:  [facility]   PHYSICIAN:  Isidor Holts, M.D.  DATE OF BIRTH:  [birth year]                                 PROGRESS NOTE   DATE OF DISCHARGE:  To be determined.   DISCHARGE DIAGNOSES:  Refer to the interim summary dictated by Ladell Pier, M.D. on  November 19, 2007.   For details of admission, consultations, procedures and clinical course,  referred to above-mentioned interim summary.  However, from November 20, 2007 to November 25, 2007, i.e., the date of this dictation, the  following are pertinent:  The patient continued to show steady clinical  improvement on parenteral steroids and parenteral Bactrim, although he  was requiring oxygen supplementation with 60-80% non-rebreather mask as  of November 20, 2007.  However, his oxygen requirements continued to  drop steadily, and by November 22, 2007, it was down to 28% oxygen by  Ventimask and he was able maintain saturations well above 96%.  He was  therefore transitioned to oral steroids in a tapering course.  He has  tolerated this quite well, and oxygen requirements continued to improve.  Hemoglobin has remained stable, and as a matter of fact, as of November 24, 2007, hemoglobin was 13.1 with a hematocrit of 39.6.  He has been  counseled appropriately, with regards to smoking.  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  The patient is malnourished and cachectic; however, he  has been seen by nutritionist and his appetite is really very good.  It  is anticipated that he will be able to regain satisfactory nutritional  status, provided his primary underlying condition is brought under  adequate control.  His  clinical course has been closely followed by the  infectious disease team, including Dr. Cliffton Asters and Dr. Sampson Goon.  This patient has been commenced on prophylactic treatment with  Azithromycin and Diflucan.  He will require HAART on an outpatient  basis, and arrangements have been put in place for the process to be set  in motion, to get the patient set up for AIDS drug assistance program  (ADAP), under the auspices of the infectious disease team.   DISPOSITION:  This will be elucidated in detail in addendum at the appropriate time,  in addition to discharge medications.  However, the patient has made  satisfactory progress and it is anticipated that over the next few days,  he will be successfully weaned off oxygen and be sufficiently clinically  stable to be discharged for follow up, with the infectious disease  clinic.      Isidor Holts, M.D.  Electronically Signed     CO/MEDQ  D:  11/25/2007  T:  11/25/2007  Job:  [id number]

## 2010-08-02 NOTE — Discharge Summary (Signed)
William [name], William [name]             ACCOUNT NO.:  [id number]   MEDICAL RECORD NO.:  [id number]          PATIENT TYPE:  INP   LOCATION:  1516                         FACILITY:  [facility]   PHYSICIAN:  Michelene Gardener, MD    DATE OF BIRTH:  05/11/1965   DATE OF ADMISSION:  11/14/2007  DATE OF DISCHARGE:  12/12/2007                               DISCHARGE SUMMARY   ADDENDUM TO DISCHARGE SUMMARY:   DISCHARGE DIAGNOSES:  1. PCP pneumonia.  2. Advanced HIV with AIDS syndrome.  3. Hypoxemia secondary to PCP.  4. Elevated LFTs with negative hepatitis profile panel.  5. Tachycardia.  6. Microcytic anemia.  7. Hyponatremia, resolved.  8. Failure to thrive.  9. Tobacco abuse.   DISCHARGE MEDICATIONS:  1. Zithromax 1200 mg p.o. weekly.  2. Atripla one tablet p.o. at bedtime.  3. Ensure 237 ml p.o. three times daily.  4. Diflucan 100 mg p.o. weekly.  5. Flonase spray daily.  6. Multivitamin one tablet p.o. once daily.  7. Bactrim one tablet p.o. once daily.  8. Robitussin 5 mg p.o. q.4 h as needed.   CONSULTATIONS:  ID consult.   PROCEDURES:  None since last dictation.   RADIOLOGY STUDIES:  Since last dictation:  1. Chest x-ray on September 8 showed little change to minimal increase      in opacities bilaterally consistent with pneumonia.  2. Chest x-ray on September 21 showed persistent bilateral airspace      disease without interval change.   COURSE OF HOSPITALIZATION:  For detailed course of hospitalization,  please refer to the previous discharge summary that was done on  September 1 and September 7.  Since that time, the patient continued to  have slow and steady improvement.  As mentioned before, this patient was  maintained on bactrim, Zithromax and Diflucan.  Infectious Disease  continued to follow this patient.  On September 23, the patient was  started on a Atripla.   The patient continued to need oxygen while in the hospital.  On  September 24, his oxygen was taken off  and he was saturating 90% on room  air.  The patient walked in the hallway, and his saturation dropped to  80%.  Apparently, he will need home oxygen, and I will prescribe 2  liters of home oxygen.  The patient currently is stable.  Will be  discharged on the above-mentioned medications.  He was accepted for  placement, and he will be discharged today.  Social worker has been  following his case while in the hospital.   TOTAL ASSESSMENT TIME:  One hour.      Michelene Gardener, MD  Electronically Signed     NAE/MEDQ  D:  12/13/2007  T:  12/13/2007  Job:  [phone]

## 2010-08-02 NOTE — Discharge Summary (Signed)
William [name], William [name] NO.:  [id number]   MEDICAL RECORD NO.:  [id number]          PATIENT TYPE:  INP   LOCATION:  1222                         FACILITY:  [facility]   PHYSICIAN:  Ladell Pier, M.D.   DATE OF BIRTH:  1965/05/19   DATE OF ADMISSION:  11/14/2007  DATE OF DISCHARGE:                               DISCHARGE SUMMARY   DISCHARGE DIAGNOSIS:  1. HIV AIDS.  2. Question of pneumocystis (carinii) jiroveci pneumonia unable to      induce sputum.  3. Leukocytosis.  4. Elevated LFTs with negative hepatitis panel.  5. Microcytic anemia.  6. Hyponatremia.  7. Cachectic.  8. Tobacco abuse.   DISCHARGE MEDICATIONS:  To be determined.   FOLLOWUP APPOINTMENTS:  To be determined.   PROCEDURES:  None.   CONSULTANTS:  ID, Dr. Maurice March.   HISTORY OF PRESENT ILLNESS:  The patient is a 45 year old gentleman that  is at a shelter.  He complains of progressive shortness of breath for  the past 2 weeks that got even worse the morning prior to presentation.  He had no fevers but he did complain of some chills and cough productive  of yellow mucus.  Please see admission note for remainder of history,  past medical history, family history, social history, meds, allergies,  review of systems per admission H&P.   PHYSICAL EXAMINATION:  VITAL SIGNS:  Temperature 97.4, pulse of 75,  blood pressure 115/68, respirations 23, pulse ox 94% on non-rebreather.  HEENT: Normocephalic, atraumatic.  Pupils reactive to light.  Throat  without erythema.  CARDIOVASCULAR: Regular rate and rhythm.  LUNGS: Clear bilaterally.  ABDOMEN: Positive bowel sounds.  EXTREMITIES:  Without edema.   HOSPITAL COURSE:  1. Question of PCP pneumonia:  The patient was admitted to the      hospital with possible pneumonia.  His chest x-ray showed leukocyte      viral pneumonia.  His LDH level was elevated. On checking HIV      status, HIV was positive, his confirmatory test is pending.  CD-4      count,  however, is 30.  The patient most likely has AIDS.  Will      need prophylactic of Bactrim and azithromycin. He is presently on      Bactrim and steroids for PCP pneumonia, however, has not been able      to induce any sputum.  Will check with Dr. Nedra Hai to see if probably      consulted CCM for bronchoscopy to get a sample, would be      appropriate in this case.  2. Leukocytosis:  This is secondary to steroids and the infection.  It      is improving.  3. Elevated liver function tests:  The liver function test is      elevated, however, his hepatitis panel is negative.  Will recheck      his LFTs later on during his hospital stay.  4. Microcytic anemia:  He does have a microcytic anemia.  Will check      an anemia panel. No known history of GI bleed.  5.  Hyponatremia:  He had some mild hyponatremia which is most likely      secondary to SIADH from his pulmonary process.  6. Tobacco abuse:  Will encourage smoking cessation.  7. Homeless: Will get social worker to help with discharge medications      and placement.  Labs will be dictated at the time of discharge.      Ladell Pier, M.D.  Electronically Signed     N[place]/MEDQ  D:  11/19/2007  T:  11/19/2007  Job:  [id number]

## 2010-12-19 LAB — BASIC METABOLIC PANEL
BUN: 15
BUN: 18
BUN: 26 — ABNORMAL HIGH
CO2: 24
CO2: 26
Calcium: 8.7
Creatinine, Ser: 0.62
Creatinine, Ser: 0.68
GFR calc Af Amer: >60
GFR calc non Af Amer: >60
GFR calc non Af Amer: >60
GFR calc non Af Amer: >60
GFR calc non Af Amer: >60
Glucose, Bld: 106 — ABNORMAL HIGH
Glucose, Bld: 136 — ABNORMAL HIGH
Glucose, Bld: 90
Glucose, Bld: 97
Potassium: 4.5
Potassium: 4.5
Potassium: 4.9
Sodium: 131 — ABNORMAL LOW

## 2010-12-19 LAB — CBC
HCT: 37.5 — ABNORMAL LOW
HCT: 37.9 — ABNORMAL LOW
Hemoglobin: 12.2 — ABNORMAL LOW
Hemoglobin: 12.5 — ABNORMAL LOW
MCHC: 32.6
MCHC: 33
Platelets: 209
Platelets: 233
RBC: 4.9
RDW: 16 — ABNORMAL HIGH
RDW: 16.7 — ABNORMAL HIGH
RDW: 19 — ABNORMAL HIGH
WBC: 5.2

## 2010-12-19 LAB — B-NATRIURETIC PEPTIDE (CONVERTED LAB): Pro B Natriuretic peptide (BNP): <30

## 2010-12-21 LAB — CBC
HCT: 36.4 — ABNORMAL LOW
HCT: 38 — ABNORMAL LOW
HCT: 39.6
HCT: 40.5
Hemoglobin: 11.6 — ABNORMAL LOW
Hemoglobin: 11.9 — ABNORMAL LOW
Hemoglobin: 12.6 — ABNORMAL LOW
Hemoglobin: 12.9 — ABNORMAL LOW
Hemoglobin: 13.1
MCHC: 31.8
MCHC: 31.9
MCHC: 32.1
MCHC: 33
MCV: 75.3 — ABNORMAL LOW
MCV: 76.6 — ABNORMAL LOW
MCV: 76.6 — ABNORMAL LOW
Platelets: 307
Platelets: 372
Platelets: 386
RBC: 4.73
RBC: 5.27
RBC: 5.28
RDW: 14.4
RDW: 14.6
RDW: 14.6
RDW: 14.9
RDW: 14.9
WBC: 10.3
WBC: 10.8 — ABNORMAL HIGH
WBC: 6.1
WBC: 8

## 2010-12-21 LAB — BASIC METABOLIC PANEL
BUN: 12
BUN: 13
BUN: 13
BUN: 17
CO2: 21
CO2: 24
CO2: 24
CO2: 24
Calcium: 8.7
Calcium: 8.8
Calcium: 8.8
Calcium: 8.9
Calcium: 9
Chloride: 105
Chloride: 95 — ABNORMAL LOW
Chloride: 95 — ABNORMAL LOW
Chloride: 96
Chloride: 97
Chloride: 97
Creatinine, Ser: 0.62
Creatinine, Ser: 0.63
Creatinine, Ser: 0.64
Creatinine, Ser: 0.68
Creatinine, Ser: 0.68
Creatinine, Ser: 0.69
GFR calc Af Amer: >60
GFR calc Af Amer: >60
GFR calc Af Amer: >60
GFR calc non Af Amer: >60
GFR calc non Af Amer: >60
GFR calc non Af Amer: >60
GFR calc non Af Amer: >60
Glucose, Bld: 109 — ABNORMAL HIGH
Glucose, Bld: 115 — ABNORMAL HIGH
Glucose, Bld: 119 — ABNORMAL HIGH
Glucose, Bld: 124 — ABNORMAL HIGH
Glucose, Bld: 127 — ABNORMAL HIGH
Glucose, Bld: 127 — ABNORMAL HIGH
Glucose, Bld: 127 — ABNORMAL HIGH
Potassium: 3.8
Potassium: 3.8
Potassium: 4
Potassium: 4.2
Potassium: 4.9
Sodium: 127 — ABNORMAL LOW
Sodium: 128 — ABNORMAL LOW
Sodium: 129 — ABNORMAL LOW
Sodium: 130 — ABNORMAL LOW
Sodium: 133 — ABNORMAL LOW

## 2010-12-21 LAB — CULTURE, RESPIRATORY W GRAM STAIN: Gram Stain: NONE SEEN

## 2010-12-21 LAB — GLUCOSE, CAPILLARY
Glucose-Capillary: 102 — ABNORMAL HIGH
Glucose-Capillary: 148 — ABNORMAL HIGH
Glucose-Capillary: 155 — ABNORMAL HIGH

## 2010-12-21 LAB — P CARINII SMEAR DFA: Pneumocystis carinii DFA: NEGATIVE

## 2010-12-21 LAB — CARDIAC PANEL(CRET KIN+CKTOT+MB+TROPI)
CK, MB: 1.1
Relative Index: INVALID
Total CK: 43
Troponin I: 0.01

## 2010-12-21 LAB — EXPECTORATED SPUTUM ASSESSMENT W GRAM STAIN, RFLX TO RESP C

## 2010-12-21 LAB — CRYPTOCOCCAL ANTIGEN

## 2011-01-09 ENCOUNTER — Other Ambulatory Visit: Payer: Medicaid Other

## 2011-01-09 DIAGNOSIS — B20 Human immunodeficiency virus [HIV] disease: Secondary | ICD-10-CM

## 2011-01-09 LAB — COMPREHENSIVE METABOLIC PANEL
ALT: 11 U/L (ref 0–53)
AST: 21 U/L (ref 0–37)
CO2: 23 mEq/L (ref 19–32)
Creat: 0.82 mg/dL (ref 0.50–1.35)
Total Bilirubin: 0.3 mg/dL (ref 0.3–1.2)

## 2011-01-09 LAB — LIPID PANEL
HDL: 41 mg/dL (ref >39)
LDL Cholesterol: 103 mg/dL — ABNORMAL HIGH (ref 0–99)
Total CHOL/HDL Ratio: 3.9 Ratio
Triglycerides: 82 mg/dL (ref <150)
VLDL: 16 mg/dL (ref 0–40)

## 2011-01-09 LAB — CBC
HCT: 47.7 % (ref 39.0–52.0)
MCV: 79.2 fL (ref 78.0–100.0)
RBC: 6.02 MIL/uL — ABNORMAL HIGH (ref 4.22–5.81)
WBC: 5.4 10*3/uL (ref 4.0–10.5)

## 2011-01-10 LAB — T-HELPER CELL (CD4) - (RCID CLINIC ONLY): CD4 T Cell Abs: 360 uL — ABNORMAL LOW (ref 400–2700)

## 2011-01-23 ENCOUNTER — Ambulatory Visit (INDEPENDENT_AMBULATORY_CARE_PROVIDER_SITE_OTHER): Payer: Medicaid Other | Admitting: Internal Medicine

## 2011-01-23 ENCOUNTER — Encounter: Payer: Self-pay | Admitting: Internal Medicine

## 2011-01-23 VITALS — BP 129/86 | HR 91 | Temp 98.3°F | Ht 71.0 in | Wt 146.5 lb

## 2011-01-23 DIAGNOSIS — B20 Human immunodeficiency virus [HIV] disease: Secondary | ICD-10-CM

## 2011-01-23 DIAGNOSIS — F17200 Nicotine dependence, unspecified, uncomplicated: Secondary | ICD-10-CM

## 2011-01-23 DIAGNOSIS — Z23 Encounter for immunization: Secondary | ICD-10-CM

## 2011-01-23 NOTE — Progress Notes (Signed)
  Subjective:    Patient ID: William [name], male    DOB: 1965-11-06, 45 y.o.   MRN: [id number]  HPI [name] is in for his routine visit. He denies ever missing a dose of his Atripla. He states that he feels better now than he has in many years. He continues to smoke cigarettes but has cut down from one pack to about a half pack daily. Several years ago he quit cold [place] and did not smoke cigarettes for one year before he restarted.    Review of Systems     Objective:   Physical Exam  Constitutional: He appears well-developed and well-nourished. No distress.  HENT:  Mouth/Throat: Oropharynx is clear and moist. No oropharyngeal exudate.  Eyes: Conjunctivae are normal.  Cardiovascular: Normal rate, regular rhythm and normal heart sounds.   No murmur heard. Pulmonary/Chest: Breath sounds normal. He has no rales.  Skin: No rash noted.  Psychiatric: He has a normal mood and affect.   HIV 1 RNA Quant (copies/mL)  Date Value  01/09/2011 59*  06/06/2010 <20   11/08/2009 <48 copies/mL      CD4 T Cell Abs (cmm)  Date Value  01/09/2011 360*  06/06/2010 210*  11/08/2009 300*              Assessment & Plan:

## 2011-01-23 NOTE — Assessment & Plan Note (Addendum)
William [name] HIV infection is under good control with a CD4 count of 360 and a Viral load of 59 . I will continue his current regimen.

## 2011-01-23 NOTE — Assessment & Plan Note (Signed)
I have encouraged him to consider quitting again and have given him written information about resources to help him quit.

## 2011-06-23 ENCOUNTER — Other Ambulatory Visit: Payer: Self-pay | Admitting: Internal Medicine

## 2011-06-23 DIAGNOSIS — B20 Human immunodeficiency virus [HIV] disease: Secondary | ICD-10-CM

## 2011-08-22 ENCOUNTER — Other Ambulatory Visit: Payer: Medicaid Other

## 2011-08-22 DIAGNOSIS — B20 Human immunodeficiency virus [HIV] disease: Secondary | ICD-10-CM

## 2011-08-22 LAB — CBC
HCT: 45.4 % (ref 39.0–52.0)
RDW: 14.7 % (ref 11.5–15.5)
WBC: 7.2 10*3/uL (ref 4.0–10.5)

## 2011-08-22 LAB — COMPREHENSIVE METABOLIC PANEL
ALT: 17 U/L (ref 0–53)
AST: 20 U/L (ref 0–37)
Albumin: 4.2 g/dL (ref 3.5–5.2)
BUN: 9 mg/dL (ref 6–23)
Calcium: 9.3 mg/dL (ref 8.4–10.5)
Chloride: 109 mEq/L (ref 96–112)
Potassium: 3.9 mEq/L (ref 3.5–5.3)

## 2011-08-22 LAB — LIPID PANEL: HDL: 45 mg/dL (ref >39)

## 2011-08-23 LAB — T-HELPER CELL (CD4) - (RCID CLINIC ONLY)
CD4 % Helper T Cell: 16 % — ABNORMAL LOW (ref 33–55)
CD4 T Cell Abs: 390 uL — ABNORMAL LOW (ref 400–2700)

## 2011-08-23 LAB — RPR

## 2011-08-24 LAB — HIV-1 RNA QUANT-NO REFLEX-BLD: HIV-1 RNA Quant, Log: <1.3 {Log} (ref <1.30)

## 2011-09-05 ENCOUNTER — Encounter: Payer: Self-pay | Admitting: Internal Medicine

## 2011-09-05 ENCOUNTER — Ambulatory Visit (INDEPENDENT_AMBULATORY_CARE_PROVIDER_SITE_OTHER): Payer: Medicaid Other | Admitting: Internal Medicine

## 2011-09-05 VITALS — BP 119/81 | HR 88 | Temp 98.3°F | Ht 71.0 in | Wt 147.5 lb

## 2011-09-05 DIAGNOSIS — B20 Human immunodeficiency virus [HIV] disease: Secondary | ICD-10-CM

## 2011-09-05 NOTE — Progress Notes (Signed)
Patient ID: William [name], male   DOB: 08/31/1965, 46 y.o.   MRN: [id number]     [facility] for Infectious Disease  Patient Active Problem List  Diagnosis  . HIV DISEASE  . PNEUMOCYSTIS PNEUMONIA  . CIGARETTE SMOKER  . DENTAL CARIES    Patient's Medications  New Prescriptions   No medications on file  Previous Medications   ATRIPLA 600-200-300 MG PER TABLET    TAKE 1 TABLET BY MOUTH AT BEDTIME DAILY  Modified Medications   No medications on file  Discontinued Medications   No medications on file    Subjective: William [name] is in for his routine visit. He states that he never misses a single dose of his Atripla. He is feeling well. He continues to smoke about one half pack of cigarettes daily and has no current plans to quit completely. He did call the [place] quit line after his last visit. He quit for 3 years in the past but started back several years ago. 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  Objective: Temp: 98.3 F (36.8 C) (06/18 1021) Temp src: Oral (06/18 1021) BP: 119/81 mmHg (06/18 1021) Pulse Rate: 88  (06/18 1021)  General: He appears healthy and in good spirits Skin: No rash Lungs: Clear Cor:  Regular S1 and S2 no murmurs  Lab Results HIV 1 RNA Quant (copies/mL)  Date Value  08/22/2011 <20   01/09/2011 59*  06/06/2010 <20      CD4 T Cell Abs (cmm)  Date Value  08/22/2011 390*  01/09/2011 360*  06/06/2010 210*     Assessment: William [name] HIV infection is under excellent control in his CD4 count is slowly reconstituted from 30 to near normal now over the past 4 years. I will continue Atripla.  Plan: 1. Continue Atripla 2. Cigarette cessation encouraged 3. Encouraged to establish primary care [place] 4. Followup here after blood work in 6 months   Cliffton Asters, MD [facility] for Infectious Disease [facility] [phone] pager   [phone] cell 09/05/2011, 10:35 AM

## 2012-02-07 ENCOUNTER — Other Ambulatory Visit (INDEPENDENT_AMBULATORY_CARE_PROVIDER_SITE_OTHER): Payer: Medicaid Other

## 2012-02-07 DIAGNOSIS — B20 Human immunodeficiency virus [HIV] disease: Secondary | ICD-10-CM

## 2012-02-07 LAB — COMPREHENSIVE METABOLIC PANEL
Albumin: 4.4 g/dL (ref 3.5–5.2)
BUN: 8 mg/dL (ref 6–23)
CO2: 23 mEq/L (ref 19–32)
Calcium: 9.5 mg/dL (ref 8.4–10.5)
Chloride: 111 mEq/L (ref 96–112)
Glucose, Bld: 100 mg/dL — ABNORMAL HIGH (ref 70–99)
Potassium: 4 mEq/L (ref 3.5–5.3)

## 2012-02-07 LAB — LIPID PANEL: Cholesterol: 185 mg/dL (ref 0–200)

## 2012-02-07 LAB — CBC
Hemoglobin: 15.1 g/dL (ref 13.0–17.0)
MCHC: 33.5 g/dL (ref 30.0–36.0)
Platelets: 251 10*3/uL (ref 150–400)
RBC: 5.81 MIL/uL (ref 4.22–5.81)

## 2012-02-07 LAB — RPR

## 2012-02-08 LAB — HIV-1 RNA QUANT-NO REFLEX-BLD
HIV 1 RNA Quant: <20 copies/mL (ref <20)
HIV-1 RNA Quant, Log: <1.3 {Log} (ref <1.30)

## 2012-02-08 LAB — T-HELPER CELL (CD4) - (RCID CLINIC ONLY): CD4 % Helper T Cell: 15 % — ABNORMAL LOW (ref 33–55)

## 2012-02-20 ENCOUNTER — Other Ambulatory Visit: Payer: Medicaid Other

## 2012-02-29 ENCOUNTER — Encounter: Payer: Self-pay | Admitting: Internal Medicine

## 2012-02-29 ENCOUNTER — Ambulatory Visit (INDEPENDENT_AMBULATORY_CARE_PROVIDER_SITE_OTHER): Payer: Medicaid Other | Admitting: Internal Medicine

## 2012-02-29 VITALS — BP 115/76 | HR 76 | Temp 97.4°F | Ht 71.0 in | Wt 140.5 lb

## 2012-02-29 DIAGNOSIS — B20 Human immunodeficiency virus [HIV] disease: Secondary | ICD-10-CM

## 2012-02-29 DIAGNOSIS — Z23 Encounter for immunization: Secondary | ICD-10-CM

## 2012-02-29 NOTE — Progress Notes (Signed)
Patient ID: William [name], male   DOB: 12-03-1965, 46 y.o.   MRN: [id number]     [facility] for Infectious Disease  Patient Active Problem List  Diagnosis  . HIV DISEASE  . PNEUMOCYSTIS PNEUMONIA  . CIGARETTE SMOKER  . DENTAL CARIES    Patient's Medications  New Prescriptions   No medications on file  Previous Medications   ATRIPLA 600-200-300 MG PER TABLET    TAKE 1 TABLET BY MOUTH AT BEDTIME DAILY   MULTIPLE VITAMIN (MULTIVITAMIN) TABLET    Take 1 tablet by mouth daily.  Modified Medications   No medications on file  Discontinued Medications   No medications on file    Subjective: William [name] is in for his routine visit. He denies missing a single dose of Atripla since his last visit. He is down to 5 cigarettes a day and has been thinking about quitting.  Objective: Temp: 97.4 F (36.3 C) (12/12 0946) Temp src: Oral (12/12 0946) BP: 115/76 mmHg (12/12 0946) Pulse Rate: 76  (12/12 0946)  General: He is in good spirits Skin: No rash Lungs: Clear Cor: Regular S1 and S2 no murmurs Abdomen: Soft and nontender  Lab Results HIV 1 RNA Quant (copies/mL)  Date Value  02/07/2012 <20   08/22/2011 <20   01/09/2011 59*     CD4 T Cell Abs (cmm)  Date Value  02/07/2012 440   08/22/2011 390*  01/09/2011 360*     Assessment: His HIV is under excellent control in his CD4 count has reconstituted from 30 to 440 over the last 5 years. I will continue Atripla.  I talked to him about cigarette cessation.  Plan: 1. Continue Atripla 2. Encouraged to establish a primary care physician 3. Cigarette cessation counseling provided 4. Condoms provided 5. Influenza vaccine given 6. Followup after lab work in 6 months   Cliffton Asters, MD [facility] for Infectious Disease [facility] [phone] pager   [phone] cell 02/29/2012, 10:04 AM

## 2012-03-05 ENCOUNTER — Ambulatory Visit: Payer: Medicaid Other | Admitting: Internal Medicine

## 2012-05-21 ENCOUNTER — Telehealth: Payer: Self-pay | Admitting: *Deleted

## 2012-05-21 NOTE — Telephone Encounter (Signed)
Patient's wife called stating that Dr. Orvan Falconer wanted him to find a primary care doctor. His Medicaid card has [facility] listed on it and she is going to call his social worker to have this changed. She said she is going to take him to urgent care because he is c/o of leg pain. She had already called there and was told they will take his Medicaid. William [name]

## 2012-08-28 ENCOUNTER — Other Ambulatory Visit (INDEPENDENT_AMBULATORY_CARE_PROVIDER_SITE_OTHER): Payer: Medicaid Other

## 2012-08-28 DIAGNOSIS — B20 Human immunodeficiency virus [HIV] disease: Secondary | ICD-10-CM

## 2012-08-28 LAB — COMPREHENSIVE METABOLIC PANEL
Alkaline Phosphatase: 74 U/L (ref 39–117)
Creat: 0.95 mg/dL (ref 0.50–1.35)
Glucose, Bld: 87 mg/dL (ref 70–99)
Sodium: 138 mEq/L (ref 135–145)
Total Bilirubin: 0.2 mg/dL — ABNORMAL LOW (ref 0.3–1.2)
Total Protein: 6.5 g/dL (ref 6.0–8.3)

## 2012-08-28 LAB — LIPID PANEL
Cholesterol: 159 mg/dL (ref 0–200)
HDL: 47 mg/dL (ref >39)
LDL Cholesterol: 97 mg/dL (ref 0–99)
Total CHOL/HDL Ratio: 3.4 Ratio
Triglycerides: 74 mg/dL (ref <150)
VLDL: 15 mg/dL (ref 0–40)

## 2012-08-28 LAB — CBC
Hemoglobin: 14 g/dL (ref 13.0–17.0)
MCH: 25.3 pg — ABNORMAL LOW (ref 26.0–34.0)
MCHC: 33.7 g/dL (ref 30.0–36.0)
MCV: 75 fL — ABNORMAL LOW (ref 78.0–100.0)

## 2012-08-29 LAB — HIV-1 RNA QUANT-NO REFLEX-BLD: HIV 1 RNA Quant: <20 copies/mL (ref <20)

## 2012-09-11 ENCOUNTER — Ambulatory Visit: Payer: Medicaid Other | Admitting: Internal Medicine

## 2012-09-17 ENCOUNTER — Ambulatory Visit (INDEPENDENT_AMBULATORY_CARE_PROVIDER_SITE_OTHER): Payer: Medicaid Other | Admitting: Internal Medicine

## 2012-09-17 ENCOUNTER — Encounter: Payer: Self-pay | Admitting: Internal Medicine

## 2012-09-17 VITALS — BP 124/80 | HR 81 | Temp 97.5°F | Ht 71.0 in | Wt 141.5 lb

## 2012-09-17 DIAGNOSIS — Z23 Encounter for immunization: Secondary | ICD-10-CM

## 2012-09-17 DIAGNOSIS — B20 Human immunodeficiency virus [HIV] disease: Secondary | ICD-10-CM

## 2012-09-17 NOTE — Progress Notes (Signed)
Patient ID: William [name], male   DOB: 09/18/1965, 47 y.o.   MRN: [id number]          [facility] for Infectious Disease  Patient Active Problem List   Diagnosis Date Noted  . CIGARETTE SMOKER 06/02/2008  . HIV DISEASE 01/01/2008  . PNEUMOCYSTIS PNEUMONIA 01/01/2008  . DENTAL CARIES 01/01/2008    Patient's Medications  New Prescriptions   No medications on file  Previous Medications   ATRIPLA 600-200-300 MG PER TABLET    TAKE 1 TABLET BY MOUTH AT BEDTIME DAILY   MULTIPLE VITAMIN (MULTIVITAMIN) TABLET    Take 1 tablet by mouth daily.  Modified Medications   No medications on file  Discontinued Medications   No medications on file    Subjective: William [name] is in for his routine visit. As usual William [name] never misses a single dose of his Atripla. William [name] is feeling well and is without complaints. William [name] continues to smoke about half pack of cigarettes daily. William [name] did call to quit line on one occasion but has not followed through on setting a quit date.  Review of Systems: Pertinent items are noted in HPI.  No past medical history on file.  History  Substance Use Topics  . Smoking status: Current Every Day Smoker -- 0.30 packs/day for 30 years    Types: Cigarettes  . Smokeless tobacco: Never Used     Comment: cutting down on his smoking  . Alcohol Use: No    No family history on file.  No Known Allergies  Objective: Temp: 97.5 F (36.4 C) (07/01 0854) Temp src: Oral (07/01 0854) BP: 124/80 mmHg (07/01 0854) Pulse Rate: 81 (07/01 0854)  General: William [name] is in good spirits  Oral: No oropharyngeal lesions  Skin: No rash  Lungs: Clear  Cor: Regular S1 and S2 with no murmurs  mood affect: Normal  Lab Results HIV 1 RNA Quant (copies/mL)  Date Value  08/28/2012 <20   02/07/2012 <20   08/22/2011 <20      CD4 T Cell Abs (cmm)  Date Value  08/28/2012 440   02/07/2012 440   08/22/2011 390*   Lab Results  Component Value Date   WBC 5.5 08/28/2012   HGB 14.0 08/28/2012   HCT 41.5  08/28/2012   MCV 75.0* 08/28/2012   PLT 221 08/28/2012   BMET    Component Value Date/Time   NA 138 08/28/2012 0912   K 4.2 08/28/2012 0912   CL 106 08/28/2012 0912   CO2 23 08/28/2012 0912   GLUCOSE 87 08/28/2012 0912   BUN 10 08/28/2012 0912   CREATININE 0.95 08/28/2012 0912   CREATININE 0.90 06/06/2010 1739   CALCIUM 8.9 08/28/2012 0912   GFRNONAA >60 12/11/2007 0525   GFRAA  Value: >60        The eGFR has been calculated using the MDRD equation. This calculation has not been validated in all clinical 12/11/2007 0525   Lab Results  Component Value Date   ALT 11 08/28/2012   AST 15 08/28/2012   ALKPHOS 74 08/28/2012   BILITOT 0.2* 08/28/2012   Lab Results  Component Value Date   CHOL 159 08/28/2012   HDL 47 08/28/2012   LDLCALC 97 08/28/2012   TRIG 74 08/28/2012   CHOLHDL 3.4 08/28/2012    Assessment: His HIV infection is under excellent control and William [name] said complete CD4 reconstitution over the last 5 years. I will continue Atripla.  I talked to him at length about the importance of quitting cigarettes. I  encouraged him to call the quit line again and develop a plan to quit completely.  Plan: 1. Continue Atripla 2. Cigarette cessation counseling provided 3. Follow. After lab work in 6 months   Cliffton Asters, MD [facility] for Infectious Disease [facility] [phone] pager   [phone] cell 09/17/2012, 9:35 AM

## 2012-10-21 ENCOUNTER — Other Ambulatory Visit: Payer: Self-pay | Admitting: *Deleted

## 2012-10-21 DIAGNOSIS — B20 Human immunodeficiency virus [HIV] disease: Secondary | ICD-10-CM

## 2012-10-21 MED ORDER — EFAVIRENZ-EMTRICITAB-TENOFOVIR 600-200-300 MG PO TABS: ORAL_TABLET | ORAL | Status: DC

## 2013-01-23 ENCOUNTER — Other Ambulatory Visit: Payer: Self-pay

## 2013-06-17 ENCOUNTER — Other Ambulatory Visit: Payer: Medicaid Other

## 2013-06-17 DIAGNOSIS — B20 Human immunodeficiency virus [HIV] disease: Secondary | ICD-10-CM

## 2013-06-17 DIAGNOSIS — Z79899 Other long term (current) drug therapy: Secondary | ICD-10-CM

## 2013-06-17 DIAGNOSIS — Z113 Encounter for screening for infections with a predominantly sexual mode of transmission: Secondary | ICD-10-CM

## 2013-06-17 LAB — LIPID PANEL
CHOLESTEROL: 169 mg/dL (ref 0–200)
HDL: 50 mg/dL (ref >39)
LDL CALC: 97 mg/dL (ref 0–99)
TRIGLYCERIDES: 110 mg/dL (ref <150)
Total CHOL/HDL Ratio: 3.4 Ratio
VLDL: 22 mg/dL (ref 0–40)

## 2013-06-17 LAB — COMPREHENSIVE METABOLIC PANEL
ALBUMIN: 4.1 g/dL (ref 3.5–5.2)
ALK PHOS: 79 U/L (ref 39–117)
ALT: 13 U/L (ref 0–53)
AST: 18 U/L (ref 0–37)
BILIRUBIN TOTAL: 0.3 mg/dL (ref 0.2–1.2)
BUN: 11 mg/dL (ref 6–23)
CO2: 24 mEq/L (ref 19–32)
Calcium: 9.3 mg/dL (ref 8.4–10.5)
Chloride: 105 mEq/L (ref 96–112)
Creat: 0.76 mg/dL (ref 0.50–1.35)
GLUCOSE: 98 mg/dL (ref 70–99)
Potassium: 4.1 mEq/L (ref 3.5–5.3)
Sodium: 138 mEq/L (ref 135–145)
Total Protein: 6.8 g/dL (ref 6.0–8.3)

## 2013-06-17 LAB — CBC
HCT: 45.3 % (ref 39.0–52.0)
Hemoglobin: 15 g/dL (ref 13.0–17.0)
MCH: 25.3 pg — AB (ref 26.0–34.0)
MCHC: 33.1 g/dL (ref 30.0–36.0)
MCV: 76.5 fL — AB (ref 78.0–100.0)
PLATELETS: 213 10*3/uL (ref 150–400)
RBC: 5.92 MIL/uL — ABNORMAL HIGH (ref 4.22–5.81)
RDW: 15 % (ref 11.5–15.5)
WBC: 6.1 10*3/uL (ref 4.0–10.5)

## 2013-06-17 LAB — RPR

## 2013-06-17 NOTE — Addendum Note (Signed)
Addended bySteva Colder: Sriman Tally on: 06/17/2013 10:02 AM   Modules accepted: Orders

## 2013-06-18 LAB — HIV-1 RNA QUANT-NO REFLEX-BLD
HIV 1 RNA Quant: <20 copies/mL (ref <20)
HIV-1 RNA Quant, Log: <1.3 {Log} (ref <1.30)

## 2013-06-18 LAB — T-HELPER CELL (CD4) - (RCID CLINIC ONLY)
CD4 % Helper T Cell: 22 % — ABNORMAL LOW (ref 33–55)
CD4 T Cell Abs: 570 /uL (ref 400–2700)

## 2013-07-03 ENCOUNTER — Ambulatory Visit (INDEPENDENT_AMBULATORY_CARE_PROVIDER_SITE_OTHER): Payer: Medicaid Other | Admitting: Internal Medicine

## 2013-07-03 ENCOUNTER — Encounter: Payer: Self-pay | Admitting: Internal Medicine

## 2013-07-03 VITALS — BP 100/66 | HR 64 | Temp 97.8°F | Ht 71.0 in | Wt 144.5 lb

## 2013-07-03 DIAGNOSIS — B20 Human immunodeficiency virus [HIV] disease: Secondary | ICD-10-CM

## 2013-07-03 NOTE — Progress Notes (Signed)
Patient ID: William [name], male   DOB: 04-20-1965, 48 y.o.   MRN: [id number] @LOGODEPT         Patient Active Problem List   Diagnosis Date Noted  . CIGARETTE SMOKER 06/02/2008  . HIV DISEASE 01/01/2008  . PNEUMOCYSTIS PNEUMONIA 01/01/2008  . DENTAL CARIES 01/01/2008    Patient's Medications  New Prescriptions   No medications on file  Previous Medications   EFAVIRENZ-EMTRICITABINE-TENOFOVIR (ATRIPLA) 600-200-300 MG PER TABLET    TAKE 1 TABLET BY MOUTH AT BEDTIME DAILY   MULTIPLE VITAMIN (MULTIVITAMIN) TABLET    Take 1 tablet by mouth daily.  Modified Medications   No medications on file  Discontinued Medications   No medications on file    Subjective: William [name] is in for his routine, six-month visit. As usual he never misses a single dose of his Atripla. He is feeling well without complaints. He states he is down to smoking one half pack of cigarettes daily. About 3 years ago he did quit for nearly one year or before restarting. He states that he has been thinking about trying to quit again.  Review of Systems: Pertinent items are noted in HPI.  No past medical history on file.  History  Substance Use Topics  . Smoking status: Current Every Day Smoker -- 0.50 packs/day for 30 years    Types: Cigarettes  . Smokeless tobacco: Never Used     Comment: cutting down on his smoking  . Alcohol Use: No    No family history on file.  No Known Allergies  Objective: Temp: 97.8 F (36.6 C) (04/16 0944) Temp src: Oral (04/16 0944) BP: 100/66 mmHg (04/16 0944) Pulse Rate: 64 (04/16 0944) Body mass index is 20.16 kg/(m^2).  General: He is in good spirits Oral: No oropharyngeal lesions Skin: No rash Lungs: Clear Cor: Regular S1 and S2 with no murmurs  Lab Results Lab Results  Component Value Date   WBC 6.1 06/17/2013   HGB 15.0 06/17/2013   HCT 45.3 06/17/2013   MCV 76.5* 06/17/2013   PLT 213 06/17/2013    Lab Results  Component Value Date   CREATININE 0.76 06/17/2013    BUN 11 06/17/2013   NA 138 06/17/2013   K 4.1 06/17/2013   CL 105 06/17/2013   CO2 24 06/17/2013    Lab Results  Component Value Date   ALT 13 06/17/2013   AST 18 06/17/2013   ALKPHOS 79 06/17/2013   BILITOT 0.3 06/17/2013    Lab Results  Component Value Date   CHOL 169 06/17/2013   HDL 50 06/17/2013   LDLCALC 97 06/17/2013   TRIG 110 06/17/2013   CHOLHDL 3.4 06/17/2013    Lab Results HIV 1 RNA Quant (copies/mL)  Date Value  06/17/2013 <20   08/28/2012 <20   02/07/2012 <20      CD4 T Cell Abs (/uL)  Date Value  06/17/2013 570   08/28/2012 440   02/07/2012 440      Assessment: William [name]'s HIV infection remains under excellent control and he has had steady CD4 reconstitution over the past 5 years. I will continue Atripla.  I talked to him at length today about the importance of cigarette cessation.   Plan: 1. Continue Atripla 2. Cigarette cessation counseling provided 3. Follow up after lab work in 6 months   William AstersJohn Kingson Lohmeyer, MD [facility] [phone] pager   [phone] cell 07/03/2013, 10:01 AM

## 2013-10-13 ENCOUNTER — Other Ambulatory Visit: Payer: Self-pay | Admitting: Internal Medicine

## 2013-10-13 DIAGNOSIS — B20 Human immunodeficiency virus [HIV] disease: Secondary | ICD-10-CM

## 2014-01-01 ENCOUNTER — Other Ambulatory Visit: Payer: Medicaid Other

## 2014-01-01 DIAGNOSIS — B20 Human immunodeficiency virus [HIV] disease: Secondary | ICD-10-CM

## 2014-01-02 LAB — T-HELPER CELL (CD4) - (RCID CLINIC ONLY)
CD4 T CELL HELPER: 21 % — AB (ref 33–55)
CD4 T Cell Abs: 520 /uL (ref 400–2700)

## 2014-01-05 LAB — HIV-1 RNA QUANT-NO REFLEX-BLD
HIV 1 RNA Quant: <20 copies/mL (ref <20)
HIV-1 RNA Quant, Log: <1.3 {Log} (ref <1.30)

## 2014-01-20 ENCOUNTER — Encounter: Payer: Self-pay | Admitting: Internal Medicine

## 2014-01-20 ENCOUNTER — Ambulatory Visit (INDEPENDENT_AMBULATORY_CARE_PROVIDER_SITE_OTHER): Payer: Medicaid Other | Admitting: Internal Medicine

## 2014-01-20 VITALS — BP 120/84 | HR 88 | Temp 98.4°F | Wt 143.5 lb

## 2014-01-20 DIAGNOSIS — B20 Human immunodeficiency virus [HIV] disease: Secondary | ICD-10-CM

## 2014-01-20 DIAGNOSIS — Z23 Encounter for immunization: Secondary | ICD-10-CM

## 2014-01-20 NOTE — Progress Notes (Signed)
Patient ID: William [name], male   DOB: 05-Apr-1965, 48 y.o.   MRN: [id number]          Patient Active Problem List   Diagnosis Date Noted  . CIGARETTE SMOKER 06/02/2008  . Human immunodeficiency virus (HIV) disease 01/01/2008  . PNEUMOCYSTIS PNEUMONIA 01/01/2008  . DENTAL CARIES 01/01/2008    Patient's Medications  New Prescriptions   No medications on file  Previous Medications   ATRIPLA 600-200-300 MG PER TABLET    TAKE 1 TABLET BY MOUTH AT BEDTIME DAILY   MULTIPLE VITAMIN (MULTIVITAMIN) TABLET    Take 1 tablet by mouth daily.  Modified Medications   No medications on file  Discontinued Medications   No medications on file    Subjective: William [name] is in for his routine visit. As usual he never misses a single dose of his Atripla. He is feeling well without any complaints. He continues to smoke cigarettes. He did call the quit line and has used some of the nicotine gum he got from them but he does not have a current plan to set a quit date yet. Review of Systems: Pertinent items are noted in HPI.  No past medical history on file.  History  Substance Use Topics  . Smoking status: Current Every Day Smoker -- 0.50 packs/day for 30 years    Types: Cigarettes  . Smokeless tobacco: Never Used     Comment: cutting down on his smoking  . Alcohol Use: No    No family history on file.  No Known Allergies  Objective: Temp: 98.4 F (36.9 C) (11/03 0909) Temp Source: Oral (11/03 0909) BP: 120/84 mmHg (11/03 0909) Pulse Rate: 88 (11/03 0909) Body mass index is 20.02 kg/(m^2).  General: he is smiling and in good spirits Oral: no oropharyngeal lesions Skin: no rash Lungs: clear Cor: regular S1 and S2 with no murmurs  Lab Results Lab Results  Component Value Date   WBC 6.1 06/17/2013   HGB 15.0 06/17/2013   HCT 45.3 06/17/2013   MCV 76.5* 06/17/2013   PLT 213 06/17/2013    Lab Results  Component Value Date   CREATININE 0.76 06/17/2013   BUN 11 06/17/2013   NA  138 06/17/2013   K 4.1 06/17/2013   CL 105 06/17/2013   CO2 24 06/17/2013    Lab Results  Component Value Date   ALT 13 06/17/2013   AST 18 06/17/2013   ALKPHOS 79 06/17/2013   BILITOT 0.3 06/17/2013    Lab Results  Component Value Date   CHOL 169 06/17/2013   HDL 50 06/17/2013   LDLCALC 97 06/17/2013   TRIG 110 06/17/2013   CHOLHDL 3.4 06/17/2013    Lab Results HIV 1 RNA QUANT (copies/mL)  Date Value  01/01/2014 <20  06/17/2013 <20  08/28/2012 <20   CD4 T CELL ABS  Date Value  01/01/2014 520 /uL  06/17/2013 570 /uL  08/28/2012 440 cmm     Assessment: His infection remains under excellent control.  I have encouraged him to go ahead and set a quit date.  Plan: 1. Continue Atripla 2. Influenza vaccination today 3. Cigarette cessation counseling provided 4. Follow-up after lab work in 6 months   Cliffton AstersJohn Phillip Sandler, MD [facility] [phone] pager   [phone] cell 01/20/2014, 9:23 AM

## 2014-08-07 ENCOUNTER — Other Ambulatory Visit: Payer: Self-pay | Admitting: Internal Medicine

## 2014-09-29 ENCOUNTER — Other Ambulatory Visit: Payer: Medicaid Other

## 2014-09-29 DIAGNOSIS — B20 Human immunodeficiency virus [HIV] disease: Secondary | ICD-10-CM

## 2014-09-29 LAB — COMPREHENSIVE METABOLIC PANEL
ALBUMIN: 3.9 g/dL (ref 3.5–5.2)
ALT: 13 U/L (ref 0–53)
AST: 17 U/L (ref 0–37)
Alkaline Phosphatase: 75 U/L (ref 39–117)
BUN: 10 mg/dL (ref 6–23)
CO2: 25 mEq/L (ref 19–32)
Calcium: 8.8 mg/dL (ref 8.4–10.5)
Chloride: 106 mEq/L (ref 96–112)
Creat: 0.87 mg/dL (ref 0.50–1.35)
Glucose, Bld: 82 mg/dL (ref 70–99)
Potassium: 4.4 mEq/L (ref 3.5–5.3)
Sodium: 139 mEq/L (ref 135–145)
TOTAL PROTEIN: 6.5 g/dL (ref 6.0–8.3)
Total Bilirubin: 0.2 mg/dL (ref 0.2–1.2)

## 2014-09-29 LAB — CBC
HCT: 43 % (ref 39.0–52.0)
HEMOGLOBIN: 13.7 g/dL (ref 13.0–17.0)
MCH: 25.3 pg — AB (ref 26.0–34.0)
MCHC: 31.9 g/dL (ref 30.0–36.0)
MCV: 79.3 fL (ref 78.0–100.0)
MPV: 10 fL (ref 8.6–12.4)
PLATELETS: 235 10*3/uL (ref 150–400)
RBC: 5.42 MIL/uL (ref 4.22–5.81)
RDW: 14.9 % (ref 11.5–15.5)
WBC: 5.6 10*3/uL (ref 4.0–10.5)

## 2014-09-29 LAB — LIPID PANEL
Cholesterol: 163 mg/dL (ref 0–200)
HDL: 55 mg/dL (ref >=40)
LDL Cholesterol: 85 mg/dL (ref 0–99)
TRIGLYCERIDES: 114 mg/dL (ref <150)
Total CHOL/HDL Ratio: 3 Ratio
VLDL: 23 mg/dL (ref 0–40)

## 2014-09-30 LAB — HIV-1 RNA QUANT-NO REFLEX-BLD: HIV 1 RNA Quant: <20 copies/mL (ref <20)

## 2014-09-30 LAB — RPR

## 2014-09-30 LAB — T-HELPER CELL (CD4) - (RCID CLINIC ONLY)
CD4 % Helper T Cell: 21 % — ABNORMAL LOW (ref 33–55)
CD4 T CELL ABS: 450 /uL (ref 400–2700)

## 2014-10-13 ENCOUNTER — Ambulatory Visit (INDEPENDENT_AMBULATORY_CARE_PROVIDER_SITE_OTHER): Payer: Medicaid Other | Admitting: Internal Medicine

## 2014-10-13 ENCOUNTER — Encounter: Payer: Self-pay | Admitting: *Deleted

## 2014-10-13 ENCOUNTER — Encounter: Payer: Self-pay | Admitting: Internal Medicine

## 2014-10-13 DIAGNOSIS — B20 Human immunodeficiency virus [HIV] disease: Secondary | ICD-10-CM

## 2014-10-13 MED ORDER — ELVITEG-COBIC-EMTRICIT-TENOFAF 150-150-200-10 MG PO TABS: 1.0000 | ORAL_TABLET | Freq: Every day | ORAL | Status: DC

## 2014-10-13 NOTE — Progress Notes (Signed)
Patient ID: William [name], male   DOB: 25-Mar-1965, 49 y.o.   MRN: [id number]          Patient Active Problem List   Diagnosis Date Noted  . Human immunodeficiency virus (HIV) disease 01/01/2008    Priority: High  . CIGARETTE SMOKER 06/02/2008  . PNEUMOCYSTIS PNEUMONIA 01/01/2008  . DENTAL CARIES 01/01/2008    Patient's Medications  New Prescriptions   ELVITEGRAVIR-COBICISTAT-EMTRICITABINE-TENOFOVIR (GENVOYA) 150-150-200-10 MG TABS TABLET    Take 1 tablet by mouth daily with breakfast.  Previous Medications   MULTIPLE VITAMIN (MULTIVITAMIN) TABLET    Take 1 tablet by mouth daily.  Modified Medications   No medications on file  Discontinued Medications   ATRIPLA 600-200-300 MG PER TABLET    TAKE 1 TABLET BY MOUTH AT BEDTIME DAILY    Subjective: William [name] is in for his routine HIV follow-up visit. He has had no problems obtaining, tolerating or taking his Atripla. He takes it each evening at bedtime. He has not missed any doses. He is also still taking his multivitamin. He is down to only 4 cigarettes daily and is considering quitting completely and the month. He can already tell that he is feeling better. He recently started having some left hip pain intermittently that he rates 3 out of 10. He saw one orthopedist who recommended surgery. He sought a second opinion and was told that he should simply continue exercises and regular walking and postpone consideration of surgery until his pain becomes more bothersome. He has been exercising more regularly and is feeling better.  Review of Systems: Constitutional: negative Eyes: negative Ears, nose, mouth, throat, and face: negative Respiratory: negative Cardiovascular: negative Gastrointestinal: negative Genitourinary:negative  No past medical history on file.  History  Substance Use Topics  . Smoking status: Current Every Day Smoker -- 0.25 packs/day for 30 years    Types: Cigarettes  . Smokeless tobacco: Never Used   Comment: cutting down on his smoking  . Alcohol Use: No    No family history on file.  No Known Allergies  Objective: Temp: 98.1 F (36.7 C) (07/26 0925) Temp Source: Oral (07/26 0925) BP: 121/82 mmHg (07/26 0925) Pulse Rate: 61 (07/26 0925) Body mass index is 20.02 kg/(m^2).  General: His weight is stable at 143.5 pounds Oral: No oropharyngeal lesions Skin: No rash Lungs: Clear Cor: Regular S1 and S2 with no murmurs Abdomen: Soft and nontender with no palpable masses Joints and extremities: No swelling, redness or pain over left hip. His gait is normal. Neuro: Alert with normal speech and conversation Mood: Normal and bright  Lab Results Lab Results  Component Value Date   WBC 5.6 09/29/2014   HGB 13.7 09/29/2014   HCT 43.0 09/29/2014   MCV 79.3 09/29/2014   PLT 235 09/29/2014    Lab Results  Component Value Date   CREATININE 0.87 09/29/2014   BUN 10 09/29/2014   NA 139 09/29/2014   K 4.4 09/29/2014   CL 106 09/29/2014   CO2 25 09/29/2014    Lab Results  Component Value Date   ALT 13 09/29/2014   AST 17 09/29/2014   ALKPHOS 75 09/29/2014   BILITOT 0.2 09/29/2014    Lab Results  Component Value Date   CHOL 163 09/29/2014   HDL 55 09/29/2014   LDLCALC 85 09/29/2014   TRIG 114 09/29/2014   CHOLHDL 3.0 09/29/2014    Lab Results HIV 1 RNA QUANT (copies/mL)  Date Value  09/29/2014 <20  01/01/2014 <20  06/17/2013 <20  CD4 T CELL ABS (/uL)  Date Value  09/29/2014 450  01/01/2014 520  06/17/2013 570     Assessment: His HIV infection is under excellent control. I talked to him about newer alternative treatments that might be safer such as Genvoya. He agrees with making the switch now.  I encouraged him to go ahead and set a firm quit date within the next month and go forward with stopping cigarettes completely.  He is having mild, intermittent left hip pain. He cannot recall the names of his orthopedist. I agree with a cautious wait-and-see  approach and increased exercise. It does not appear that he needs any surgery at this time.  Plan: 1. Change Atripla to Genvoya 2. Cigarette cessation counseling provided 3. Increase exercise 4. Follow-up after lab work in 6 months   William Asters, MD [facility] for Infectious Disease [facility] [phone] pager   [phone] cell 10/13/2014, 9:45 AM

## 2014-10-14 ENCOUNTER — Telehealth: Payer: Self-pay | Admitting: *Deleted

## 2014-10-14 NOTE — Telephone Encounter (Signed)
Pt informed of upcoming appt.  New PCP appt for 10/22/14 @ 10 AM @ [facility], Dr. Loney Loh.  Pt able to make this appt.  Pt also sent a letter with this information.

## 2014-10-22 ENCOUNTER — Encounter: Payer: Self-pay | Admitting: Internal Medicine

## 2014-10-22 ENCOUNTER — Ambulatory Visit (INDEPENDENT_AMBULATORY_CARE_PROVIDER_SITE_OTHER): Payer: Medicaid Other | Admitting: Internal Medicine

## 2014-10-22 VITALS — BP 114/76 | HR 66 | Temp 98.1°F | Ht 70.75 in | Wt 142.1 lb

## 2014-10-22 DIAGNOSIS — Z23 Encounter for immunization: Secondary | ICD-10-CM

## 2014-10-22 DIAGNOSIS — F17210 Nicotine dependence, cigarettes, uncomplicated: Secondary | ICD-10-CM | POA: Diagnosis not present

## 2014-10-22 DIAGNOSIS — Z21 Asymptomatic human immunodeficiency virus [HIV] infection status: Secondary | ICD-10-CM | POA: Diagnosis not present

## 2014-10-22 DIAGNOSIS — E785 Hyperlipidemia, unspecified: Secondary | ICD-10-CM

## 2014-10-22 DIAGNOSIS — Z0000 Encounter for general adult medical examination without abnormal findings: Secondary | ICD-10-CM | POA: Insufficient documentation

## 2014-10-22 DIAGNOSIS — B20 Human immunodeficiency virus [HIV] disease: Secondary | ICD-10-CM

## 2014-10-22 DIAGNOSIS — F17200 Nicotine dependence, unspecified, uncomplicated: Secondary | ICD-10-CM

## 2014-10-22 DIAGNOSIS — M25552 Pain in left hip: Secondary | ICD-10-CM | POA: Diagnosis present

## 2014-10-22 HISTORY — DX: Hyperlipidemia, unspecified: E78.5

## 2014-10-22 MED ORDER — PRAVASTATIN SODIUM 20 MG PO TABS: 20.0000 mg | ORAL_TABLET | Freq: Every evening | ORAL | Status: DC

## 2014-10-22 NOTE — Assessment & Plan Note (Addendum)
Pt has a history of smoking 1 PPD since the age of 55. He quit for 3 years in the past and then started smoking again. At present he has cut it down 4 cigarettes per day. I discussed smoking cessation and possibility of starting nicotine patches with him today. He is not interested in quitting at this time. -Discuss again at next visit.

## 2014-10-22 NOTE — Assessment & Plan Note (Signed)
Pt has a history of L hip pain (3/10) which is currently being managed with exercises/ walking. Pt states he only gets mild pain occasionally and takes OTC pain medications only if the pain bothers him. At present he is doing well and not complaining of any pain.  -Continue current management -Encouraged patient to contact our office if the pain worsened. He has an appointment in 3 months and I will reassess his pain at that time.

## 2014-10-22 NOTE — Patient Instructions (Addendum)
William [name] it was a pleasure meeting you today. I want to congratulate you on taking your medication regulary and cutting down on the number of cigarettes you smoke everyday. If you decided to quit anytime soon please feel free to call my office, I will be more than happy to send a prescription for the Nicotine patches to your local pharmacy. I want to remind you that you have been started on a new medication today for your cholesterol - Pravastatin 20 mg 1 tablet by mouth every evening. In addition, you received Pneumococcal conjugate 13-valent vaccine today. I will see you again in 3 months. Have a wonderful day.

## 2014-10-22 NOTE — Assessment & Plan Note (Signed)
Patient has already received PPSV 23 vaccine on 11/15/2007 and 09/17/2012.  -PCV 13 today.

## 2014-10-22 NOTE — Progress Notes (Signed)
Patient ID: Shamal Stracener, male   DOB: 06/15/1965, 49 y.o.   MRN: [id number]   Subjective:   Patient ID: Adante Courington male   DOB: 10-09-1965 49 y.o.   MRN: [id number]  HPI: Mr.Giovanne Pardue is a 49 y.o. M with a PMHx of HIV, pneumocystis pneumonia, and tobacco use presenting to the clinic today to establish care. He is currently following up with Dr. Orvan Falconer in [place] for his HIV. As per Dr. Blair Dolphin notes pt was diagnosed in 12/2007 and started on Atripla. He was very compliant with his medication. Patient switched from Atripla to [place] on 10/13/2014. Most recent HIV1 RNA <20 and CD4 count 450. Patient states he doing very well and taking his medication regularly. Dr. Blair Dolphin note from 7/26 also mentions that the pt was having L hip pain (3/10) and saw an orthopedist who recommended surgery. Pt sought a second opinion and was told to continue exercises/ walking and postpone consideration of surgery. Today when asked about the pain, patient stated he gets mild pain occasionally and is not bothered by it. He usually does not take any OTC pain medications unless if the pain starts to bother him. States exercising is helping with the pain. Patient also has a history of smoking 1 PPD since the age of 49. He quit for 3 years in the past and then started smoking again. At present he has cut it down 4 cigarettes per day.    No past medical history on file. Current Outpatient Prescriptions  Medication Sig Dispense Refill  . elvitegravir-cobicistat-emtricitabine-tenofovir (GENVOYA) 150-150-200-10 MG TABS tablet Take 1 tablet by mouth daily with breakfast. 30 tablet 11  . Multiple Vitamin (MULTIVITAMIN) tablet Take 1 tablet by mouth daily.     No current facility-administered medications for this visit.   No family history on file. History   Social History  . Marital Status: Legally Separated    Spouse Name: N/A  . Number of Children: N/A  . Years of Education: N/A   Social History Main  Topics  . Smoking status: Current Every Day Smoker -- 0.25 packs/day for 30 years    Types: Cigarettes  . Smokeless tobacco: Never Used     Comment: cutting down on his smoking  . Alcohol Use: No  . Drug Use: No  . Sexual Activity: Not Currently     Comment: declined condoms   Other Topics Concern  . Not on file   Social History Narrative   Review of Systems: Review of Systems  Constitutional: Negative for fever and chills.  HENT: Negative for ear pain and sore throat.   Eyes: Negative for blurred vision, double vision and pain.  Respiratory: Negative for cough, sputum production, shortness of breath and wheezing.   Cardiovascular: Negative for chest pain, palpitations, orthopnea and leg swelling.  Gastrointestinal: Negative for heartburn, nausea, vomiting, abdominal pain, diarrhea and constipation.  Genitourinary: Negative.   Musculoskeletal: Positive for joint pain. Negative for myalgias, back pain and neck pain.       Occasional mild pain at the L hip (acetabular) joint   Skin: Negative for itching and rash.  Neurological: Negative for dizziness, sensory change, focal weakness, weakness and headaches.  Endo/Heme/Allergies: Negative.   Psychiatric/Behavioral: Negative.      Objective:  Physical Exam: Filed Vitals:   10/22/14 0951  BP: 114/76  Pulse: 66  Temp: 98.1 F (36.7 C)  TempSrc: Oral  Weight: 142 lb 1.6 oz (64.456 kg)  SpO2: 99%   Physical Exam  Constitutional: He is  oriented to person, place, and time. He appears well-developed and well-nourished. No distress.  HENT:  Head: Normocephalic and atraumatic.  Eyes: EOM are normal. Pupils are equal, round, and reactive to light.  Neck: Normal range of motion. Neck supple. No tracheal deviation present.  Cardiovascular: Normal rate, regular rhythm and intact distal pulses.  Exam reveals no gallop and no friction rub.   No murmur heard. Pulmonary/Chest: Effort normal and breath sounds normal. No respiratory  distress. He has no wheezes. He has no rales.  Abdominal: Soft. Bowel sounds are normal. He exhibits no distension. There is no tenderness.  Musculoskeletal: Normal range of motion. He exhibits no edema.  Neurological: He is alert and oriented to person, place, and time. No cranial nerve deficit.  Skin: Skin is warm and dry.  Psychiatric: He has a normal mood and affect.   Assessment & Plan:

## 2014-10-22 NOTE — Assessment & Plan Note (Addendum)
As per Dr. Blair Dolphin notes pt was diagnosed in 12/2007 and started on Atripla. He was very compliant with his medication. He was switched from Atripla to [place] on 10/13/2014. Most recent HIV1 RNA <20 and CD4 count 450. Patient states he doing very well and taking his medication regularly. He follows up with Dr. Orvan Falconer every 6 months.  -Continue current management: Genvoya (elvitegravir-cobicistat-emtricitabine-tenofovir) 150-150-200-10 mg 1 tablet QD

## 2014-10-22 NOTE — Progress Notes (Signed)
Internal Medicine Clinic Attending  I saw and evaluated the patient.  I personally confirmed the key portions of the history and exam documented by Dr. Loney Loh and I reviewed pertinent patient test results.  The assessment, diagnosis, and plan were formulated together and I agree with the documentation in the resident's note.  The patient is a long time tobacco user, the use of which is negatively affecting his hypertension and raising cardiovascular risk. Today we spent over ten minutes counseling about cessation and therapeutic options. He remains contemplative and we will follow this up in three months. ASCVD risk is around 7% but HIV has been associated with accelerated CAD, so this may be an underestimate. We are going to initiate moderate dose intensity statin for the long-term.

## 2014-10-22 NOTE — Assessment & Plan Note (Signed)
Pts 10 year ASCVD risk score is 6.4%. I spoke to him today about starting him a moderate intensity statin and he agreed. -Pravastatin 20 mg 1 tablet QD -Recheck lipid panel and LFTs at f/u visit in 3 months.

## 2015-02-09 ENCOUNTER — Encounter: Payer: Self-pay | Admitting: Internal Medicine

## 2015-02-09 ENCOUNTER — Ambulatory Visit (INDEPENDENT_AMBULATORY_CARE_PROVIDER_SITE_OTHER): Payer: Medicaid Other | Admitting: Internal Medicine

## 2015-02-09 VITALS — BP 102/76 | HR 91 | Temp 98.5°F | Wt 141.4 lb

## 2015-02-09 DIAGNOSIS — E785 Hyperlipidemia, unspecified: Secondary | ICD-10-CM | POA: Diagnosis not present

## 2015-02-09 DIAGNOSIS — R627 Adult failure to thrive: Secondary | ICD-10-CM

## 2015-02-09 DIAGNOSIS — F17200 Nicotine dependence, unspecified, uncomplicated: Secondary | ICD-10-CM

## 2015-02-09 DIAGNOSIS — Z0000 Encounter for general adult medical examination without abnormal findings: Secondary | ICD-10-CM

## 2015-02-09 DIAGNOSIS — Z23 Encounter for immunization: Secondary | ICD-10-CM

## 2015-02-09 DIAGNOSIS — IMO0002 Reserved for concepts with insufficient information to code with codable children: Secondary | ICD-10-CM

## 2015-02-09 DIAGNOSIS — M25552 Pain in left hip: Secondary | ICD-10-CM

## 2015-02-09 NOTE — Patient Instructions (Signed)
Ms. William [name] it was nice seeing you today.  -Please take Pravastatin 20 mg: 1 tablet by mouth daily  -Continue drinking ensure.  -You received your flu shot and Tdap vaccine today.   -Return for a follow-up visit in 3 months.

## 2015-02-11 DIAGNOSIS — R636 Underweight: Secondary | ICD-10-CM | POA: Insufficient documentation

## 2015-02-11 NOTE — Assessment & Plan Note (Addendum)
Patient's current BMI is 20 (within healthy range). However, he expressed his desire to gain weight. States he is drinking ensure which is helping but he wanted to know if there was any other supplement he could take. As per patient, he has always been unable to gain weight despite having a normal appetite because he is a very active person.  Denied having any unintentional WL, dysphagia, or odynophagia. Denied having any hematochezia, melena, or hematemesis.  -Suggested he eat balanced meals and continue drinking ensure supplement

## 2015-02-11 NOTE — Assessment & Plan Note (Signed)
I again discussed the risks of smoking and resources/ support available to help him quit. Patient stated he is not interested in quitting at this time. -Discuss at future visits

## 2015-02-11 NOTE — Progress Notes (Signed)
Patient ID: William [name], male   DOB: 03/05/1966, 49 y.o.   MRN: [id number]   Subjective:   Patient ID: William [name] male   DOB: 03/05/1966 49 y.o.   MRN: [id number]  HPI: [name] is a 49 y.o. M with a PMHx of conditions listed below presenting to the clinic for a follow-up of his HLD. Please see assessment and plan for the status of the patient's chronic medical conditions.     No past medical history on file. Current Outpatient Prescriptions  Medication Sig Dispense Refill  . elvitegravir-cobicistat-emtricitabine-tenofovir (GENVOYA) 150-150-200-10 MG TABS tablet Take 1 tablet by mouth daily with breakfast. 30 tablet 11  . Multiple Vitamin (MULTIVITAMIN) tablet Take 1 tablet by mouth daily.    . pravastatin (PRAVACHOL) 20 MG tablet Take 1 tablet (20 mg total) by mouth every evening. 30 tablet 5   No current facility-administered medications for this visit.   No family history on file. Social History   Social History  . Marital Status: Legally Separated    Spouse Name: N/A  . Number of Children: N/A  . Years of Education: N/A   Social History Main Topics  . Smoking status: Current Every Day Smoker -- 0.25 packs/day for 30 years    Types: Cigarettes  . Smokeless tobacco: Never Used     Comment: cutting down on his smoking  . Alcohol Use: No  . Drug Use: No  . Sexual Activity: Not Currently     Comment: declined condoms   Other Topics Concern  . None   Social History Narrative   Review of Systems: Review of Systems  Constitutional: Negative for fever and chills.  HENT: Negative for ear pain.   Eyes: Negative for blurred vision and pain.  Respiratory: Negative for cough, shortness of breath and wheezing.   Cardiovascular: Negative for chest pain and leg swelling.  Gastrointestinal: Negative for nausea and vomiting.  Musculoskeletal: Negative for myalgias.  Skin: Negative for itching and rash.  Neurological: Negative for headaches.   Objective:    Physical Exam: Filed Vitals:   02/09/15 1332  BP: 102/76  Pulse: 91  Temp: 98.5 F (36.9 C)  TempSrc: Oral  Weight: 141 lb 6.4 oz (64.139 kg)  SpO2: 98%   Physical Exam  Constitutional: He is oriented to person, place, and time. He appears well-developed and well-nourished. No distress.  HENT:  Head: Normocephalic and atraumatic.  Eyes: EOM are normal. Pupils are equal, round, and reactive to light.  Neck: Neck supple. No tracheal deviation present.  Cardiovascular: Normal rate, regular rhythm and intact distal pulses.   Pulmonary/Chest: Effort normal. No respiratory distress. He has no wheezes. He has no rales.  Abdominal: Soft. Bowel sounds are normal. He exhibits no distension. There is no tenderness.  Musculoskeletal: He exhibits no edema.  Neurological: He is alert and oriented to person, place, and time.  Skin: Skin is warm and dry.   Assessment & Plan:

## 2015-02-11 NOTE — Assessment & Plan Note (Signed)
Patient was previously prescribed Pravastatin 20 mg daily. States he [facility] the [facility] I called the pharmacy and they informed me the prescription has been there but the patient never called them to order the drug. I advised the patient to call the pharmacy to order his drug. -Lipid panel in 3 months since patient has not yet started taking this medication.

## 2015-02-11 NOTE — Assessment & Plan Note (Signed)
Tdap and influenza vaccines administered at this visit.  

## 2015-02-11 NOTE — Assessment & Plan Note (Signed)
Resolved. Patient reports not having any pain in his hip since [facility]

## 2015-02-16 NOTE — Progress Notes (Signed)
Internal Medicine Clinic Attending  I saw and evaluated the patient.  I personally confirmed the key portions of the history and exam documented by Dr. Rathore and I reviewed pertinent patient test results.  The assessment, diagnosis, and plan were formulated together and I agree with the documentation in the resident's note.  

## 2015-06-08 ENCOUNTER — Ambulatory Visit (INDEPENDENT_AMBULATORY_CARE_PROVIDER_SITE_OTHER): Payer: Medicaid Other | Admitting: Internal Medicine

## 2015-06-08 ENCOUNTER — Encounter: Payer: Self-pay | Admitting: Internal Medicine

## 2015-06-08 VITALS — BP 104/76 | HR 89 | Temp 97.9°F | Ht 71.0 in | Wt 138.5 lb

## 2015-06-08 DIAGNOSIS — F17210 Nicotine dependence, cigarettes, uncomplicated: Secondary | ICD-10-CM

## 2015-06-08 DIAGNOSIS — Z0000 Encounter for general adult medical examination without abnormal findings: Secondary | ICD-10-CM

## 2015-06-08 DIAGNOSIS — F17200 Nicotine dependence, unspecified, uncomplicated: Secondary | ICD-10-CM

## 2015-06-08 DIAGNOSIS — B20 Human immunodeficiency virus [HIV] disease: Secondary | ICD-10-CM

## 2015-06-08 DIAGNOSIS — Z21 Asymptomatic human immunodeficiency virus [HIV] infection status: Secondary | ICD-10-CM | POA: Diagnosis not present

## 2015-06-08 DIAGNOSIS — R627 Adult failure to thrive: Secondary | ICD-10-CM

## 2015-06-08 DIAGNOSIS — IMO0002 Reserved for concepts with insufficient information to code with codable children: Secondary | ICD-10-CM

## 2015-06-08 DIAGNOSIS — E785 Hyperlipidemia, unspecified: Secondary | ICD-10-CM

## 2015-06-08 NOTE — Patient Instructions (Signed)
Mr. Sharla Kidneyibblett  It was nice seeing you today. I want to congratulate you on taking your medications regularly.  I have ordered labs today and will call you when the results come back.  Please return for a follow up visit in 9 months.

## 2015-06-09 LAB — LIPID PANEL
Chol/HDL Ratio: 2.8 ratio units (ref 0.0–5.0)
Cholesterol, Total: 155 mg/dL (ref 100–199)
HDL: 55 mg/dL (ref >39)
LDL Calculated: 83 mg/dL (ref 0–99)
TRIGLYCERIDES: 86 mg/dL (ref 0–149)
VLDL Cholesterol Cal: 17 mg/dL (ref 5–40)

## 2015-06-09 LAB — PSA: PROSTATE SPECIFIC AG, SERUM: 0.9 ng/mL (ref 0.0–4.0)

## 2015-06-09 NOTE — Assessment & Plan Note (Addendum)
Currently on Genvoya. Follows up with Dr. Orvan Falconerampbell every 6 months. Viral load undetectable and CD4 450 in 09/2014.

## 2015-06-09 NOTE — Assessment & Plan Note (Signed)
Patient had cut down his smoking to 1 cigarette per day and is not ready to quit at this time.  -Discuss again at next visit

## 2015-06-09 NOTE — Progress Notes (Signed)
Patient ID: William [name], male   DOB: 02/28/1966, 50 y.o.   MRN: [id number]   Subjective:   Patient ID: William [name] male   DOB: 02/28/1966 50 y.o.   MRN: [id number]  HPI: WilliamWilliam [name] is a 50 y.o. M with a PMHx of conditions listed below presenting to the clinic for a routine follow-up of his HLD and inadequate weight gain. Please refer to assessment and plan for the status of the patient's chronic medical conditions.     No past medical history on file. Current Outpatient Prescriptions  Medication Sig Dispense Refill  . elvitegravir-cobicistat-emtricitabine-tenofovir (GENVOYA) 150-150-200-10 MG TABS tablet Take 1 tablet by mouth daily with breakfast. 30 tablet 11  . Multiple Vitamin (MULTIVITAMIN) tablet Take 1 tablet by mouth daily.    . pravastatin (PRAVACHOL) 20 MG tablet Take 1 tablet (20 mg total) by mouth every evening. 30 tablet 5   No current facility-administered medications for this visit.   No family history on file. Social History   Social History  . Marital Status: Legally Separated    Spouse Name: N/A  . Number of Children: N/A  . Years of Education: N/A   Social History Main Topics  . Smoking status: Current Every Day Smoker -- 0.25 packs/day for 30 years    Types: Cigarettes  . Smokeless tobacco: Never Used     Comment: cutting down on his smoking  . Alcohol Use: No  . Drug Use: No  . Sexual Activity: Not Currently     Comment: declined condoms   Other Topics Concern  . None   Social History Narrative   Review of Systems: Review of Systems  Constitutional: Negative for fever, chills and malaise/fatigue.  HENT: Negative for congestion and sore throat.   Eyes: Negative for blurred vision and pain.  Respiratory: Negative for cough, shortness of breath and wheezing.   Cardiovascular: Negative for chest pain, palpitations and leg swelling.  Gastrointestinal: Negative for heartburn, nausea, vomiting, abdominal pain, diarrhea, constipation, blood  in stool and melena.  Genitourinary: Negative for dysuria and flank pain.  Musculoskeletal: Negative for myalgias and joint pain.  Skin: Negative for itching and rash.  Neurological: Negative for dizziness, sensory change, focal weakness and headaches.   Objective:  Physical Exam: Filed Vitals:   06/08/15 1408  BP: 104/76  Pulse: 89  Temp: 97.9 F (36.6 C)  TempSrc: Oral  Height: 5\' 11"  (1.803 m)  Weight: 62.823 kg (138 lb 8 oz)  SpO2: 98%   Physical Exam  Constitutional: He is oriented to person, place, and time. He appears well-developed and well-nourished. No distress.  HENT:  Head: Normocephalic and atraumatic.  Mouth/Throat: Oropharynx is clear and moist. No oropharyngeal exudate.  Eyes: EOM are normal. Pupils are equal, round, and reactive to light. No scleral icterus.  Neck: Neck supple. No tracheal deviation present.  Cardiovascular: Normal rate, regular rhythm and intact distal pulses.  Exam reveals no gallop and no friction rub.   No murmur heard. Pulmonary/Chest: Effort normal and breath sounds normal. No respiratory distress. He has no wheezes. He has no rales.  Abdominal: Soft. Bowel sounds are normal. He exhibits no distension. There is no tenderness.  Musculoskeletal: He exhibits no edema or tenderness.  Neurological: He is alert and oriented to person, place, and time.  Skin: Skin is warm and dry. He is not diaphoretic.   Assessment & Plan:

## 2015-06-09 NOTE — Assessment & Plan Note (Signed)
Patient continues to stay very physically active. He has lost approximately 6 lbs since 2011. Colonoscopy was offered at this visit but patient refused. Denies having any unintentional weight loss, fevers, night sweats, dysphagia, odynphagia, GERD symptoms, hematochezia, melena, or hematemesis. He drink 2 cans of Ensure supplement per day. -Continue Ensure supplement  -Discuss colonoscopy again at next visit and offer stool cards

## 2015-06-09 NOTE — Assessment & Plan Note (Signed)
Patient does not want a colonoscopy at this time. Denies having any melena or hematochezia.  -Discuss again at next visit and offer stool cards.  PSA was checked at this visit and is normal (0.9). Patient denies having any family history of prostate cancer.

## 2015-06-09 NOTE — Assessment & Plan Note (Signed)
Currently taking Pravastatin 20 mg daily. Lipid panel at this visit showing cholesterol 155, HDL 55, and LDL 83. His 10 year ASCVD risk score is 5.7 %. No statin indicated at this time. -Discontinue Pravastatin

## 2015-06-11 NOTE — Progress Notes (Signed)
Internal Medicine Clinic Attending  Case discussed with Dr. Rathoreat the time of the visit. We reviewed the resident's history and exam and pertinent patient test results. I agree with the assessment, diagnosis, and plan of care documented in the resident's note.  

## 2016-03-07 ENCOUNTER — Encounter: Payer: Self-pay | Admitting: Internal Medicine

## 2016-03-07 ENCOUNTER — Ambulatory Visit (INDEPENDENT_AMBULATORY_CARE_PROVIDER_SITE_OTHER): Payer: Medicaid Other | Admitting: Internal Medicine

## 2016-03-07 VITALS — BP 110/77 | HR 79 | Temp 98.2°F | Ht 71.0 in | Wt 146.6 lb

## 2016-03-07 DIAGNOSIS — B20 Human immunodeficiency virus [HIV] disease: Secondary | ICD-10-CM

## 2016-03-07 DIAGNOSIS — Z6820 Body mass index (BMI) 20.0-20.9, adult: Secondary | ICD-10-CM | POA: Diagnosis not present

## 2016-03-07 DIAGNOSIS — Z79899 Other long term (current) drug therapy: Secondary | ICD-10-CM | POA: Diagnosis not present

## 2016-03-07 DIAGNOSIS — Z21 Asymptomatic human immunodeficiency virus [HIV] infection status: Secondary | ICD-10-CM

## 2016-03-07 DIAGNOSIS — Z0000 Encounter for general adult medical examination without abnormal findings: Secondary | ICD-10-CM

## 2016-03-07 DIAGNOSIS — F17210 Nicotine dependence, cigarettes, uncomplicated: Secondary | ICD-10-CM | POA: Diagnosis not present

## 2016-03-07 DIAGNOSIS — R636 Underweight: Secondary | ICD-10-CM | POA: Diagnosis not present

## 2016-03-07 DIAGNOSIS — F17200 Nicotine dependence, unspecified, uncomplicated: Secondary | ICD-10-CM

## 2016-03-07 NOTE — Patient Instructions (Addendum)
Mr. William [name] it was nice seeing you today.  -Please make an appointment with Dr. Orvan Falconerampbell as soon as possible.  -Return for a follow-up visit in 6 months.

## 2016-03-08 NOTE — Assessment & Plan Note (Addendum)
Assessment Patient is followed by Dr. Orvan Falconerampbell (infectious disease). His last visit to the infectious disease office was in July 2016. At that time, his viral load was undetectable and CD4 count was 450. Currently on Genvoya.   Plan -Continue Genvoya -Advised patient to follow-up with infectious disease very soon -If patient does not follow up with infectious disease, will check the following labs at his next visit: Renal function panel as he is on tenofovir and lipid panel as he is on a protease inhibitor.

## 2016-03-08 NOTE — Assessment & Plan Note (Signed)
Assessment Patient continues to drink ensure supplements. He has gained 8 pounds since last visit in March 2017.  Plan -Continue ensure

## 2016-03-08 NOTE — Assessment & Plan Note (Signed)
Assessment Patient continues to smoke one half pack per day. I discussed the importance of smoking cessation but patient continues to be in the pre-contemplative stage at this time.  Plan -Discuss again at next visit

## 2016-03-08 NOTE — Assessment & Plan Note (Addendum)
Patient denied referral for colonoscopy and influenza vaccine at this visit.

## 2016-03-08 NOTE — Progress Notes (Signed)
   CC: Patient is here to discuss HIV, inadequate weight gain, and smoking.  HPI:  [name] is a 50 y.o. male with a past medical history of conditions listed below presenting to the clinic to discuss HIV, inadequate weight gain, and smoking. Please see problem based charting for the status of the patient's current and chronic medical conditions.   No past medical history on file.  Review of Systems:   Review of Systems  Constitutional: Negative for chills and fever.  Respiratory: Negative for cough and shortness of breath.   Cardiovascular: Negative for chest pain and leg swelling.  Gastrointestinal: Negative for abdominal pain, diarrhea, nausea and vomiting.    Physical Exam:  Vitals:   03/07/16 1318  BP: 110/77  Pulse: 79  Temp: 98.2 F (36.8 C)  TempSrc: Oral  SpO2: 99%  Weight: 146 lb 9.6 oz (66.5 kg)  Height: 5\' 11"  (1.803 m)   Physical Exam  Constitutional: He is oriented to person, place, and time. He appears well-developed and well-nourished. No distress.  HENT:  Head: Normocephalic and atraumatic.  Mouth/Throat: Oropharynx is clear and moist.  Eyes: EOM are normal. Pupils are equal, round, and reactive to light.  Neck: Neck supple. No tracheal deviation present.  Cardiovascular: Normal rate, regular rhythm and intact distal pulses.  Exam reveals no gallop and no friction rub.   No murmur heard. Pulmonary/Chest: Effort normal and breath sounds normal. No respiratory distress. He has no wheezes. He has no rales.  Abdominal: Soft. Bowel sounds are normal. He exhibits no distension. There is no tenderness. There is no guarding.  Musculoskeletal: Normal range of motion. He exhibits no edema.  Neurological: He is alert and oriented to person, place, and time.  Skin: Skin is warm and dry.    Assessment & Plan:   See Encounters Tab for problem based charting.  Patient discussed with Dr. Criselda PeachesMullen

## 2016-03-15 NOTE — Progress Notes (Signed)
Internal Medicine Clinic Attending  Case discussed with Dr. Rathore soon after the resident saw the patient.  We reviewed the resident's history and exam and pertinent patient test results.  I agree with the assessment, diagnosis, and plan of care documented in the resident's note.  

## 2016-09-05 ENCOUNTER — Encounter: Payer: Medicaid Other | Admitting: Internal Medicine

## 2016-10-17 ENCOUNTER — Ambulatory Visit (INDEPENDENT_AMBULATORY_CARE_PROVIDER_SITE_OTHER): Payer: Medicaid Other | Admitting: Internal Medicine

## 2016-10-17 ENCOUNTER — Encounter: Payer: Self-pay | Admitting: Internal Medicine

## 2016-10-17 VITALS — BP 99/76 | HR 63 | Temp 98.6°F | Ht 71.0 in | Wt 143.2 lb

## 2016-10-17 DIAGNOSIS — R636 Underweight: Secondary | ICD-10-CM | POA: Diagnosis not present

## 2016-10-17 DIAGNOSIS — Z681 Body mass index (BMI) 19 or less, adult: Secondary | ICD-10-CM | POA: Diagnosis not present

## 2016-10-17 DIAGNOSIS — Z0000 Encounter for general adult medical examination without abnormal findings: Secondary | ICD-10-CM | POA: Diagnosis not present

## 2016-10-17 DIAGNOSIS — B20 Human immunodeficiency virus [HIV] disease: Secondary | ICD-10-CM | POA: Diagnosis not present

## 2016-10-17 DIAGNOSIS — F17210 Nicotine dependence, cigarettes, uncomplicated: Secondary | ICD-10-CM | POA: Diagnosis not present

## 2016-10-17 DIAGNOSIS — F17200 Nicotine dependence, unspecified, uncomplicated: Secondary | ICD-10-CM

## 2016-10-17 NOTE — Assessment & Plan Note (Signed)
Assessment Patient has lost 3 pounds in the past 7 months. States he is physically active and walks a lot. Reports having a good appetite. He continues to drink 2 ensure shakes per day.  Plan -Advised him to drink ensure 3 times a day if possible

## 2016-10-17 NOTE — Assessment & Plan Note (Addendum)
Assessment Patient has not followed up with infectious disease since July 2016. At that time, his viral load was undetectable and CD4 count was 450. He reports taking Genvoya daily but medication was last prescribed in July 2016 and it is unclear how he received any further refills. Our clinic spoke to ID and patient now has an appointment with them on Thursday, 10/19/2016.  Plan -Check CD4 count and viral load -Lipid panel as patient is on a protease inhibitor -Renal function panel as he is on tenofovir  Addendum 10/18/2016: Labs showing normal renal function. Cholesterol 145, triglycerides 166, HDL 37, and LDL 75. His 10 year ASCVD risk score is 6%. Spoke to the patient over the phone and advised him to go to his appointment with infectious disease tomorrow, 10/19/2016. CD4 count and viral load still pending.   Addendum 10/19/2016: CD4 count 130 and viral load 29,400. Patient was seen by infectious disease this morning.

## 2016-10-17 NOTE — Progress Notes (Signed)
   CC: Patient is here for a regular checkup. HIV, low body weight, and tobacco use were discussed during this visit.  HPI:  [name] is a 51 y.o. male with a past medical history of HIV presenting to the clinic for regular checkup. HIV, low body weight, and tobacco use were discussed during this visit. Please see problem based charting for the status of the patient's current and chronic medical conditions.   No past medical history on file. Review of Systems: Pertinent positives mentioned in HPI. Remainder of all ROS negative.   Physical Exam:  Vitals:   10/17/16 1512  BP: 99/76  Pulse: 63  Temp: 98.6 F (37 C)  TempSrc: Oral  SpO2: 100%  Weight: 143 lb 3.2 oz (65 kg)  Height: 5\' 11"  (1.803 m)   Physical Exam  Constitutional: He is oriented to person, place, and time. No distress.  HENT:  Head: Normocephalic and atraumatic.  Eyes: Right eye exhibits no discharge. Left eye exhibits no discharge.  Cardiovascular: Normal rate, regular rhythm and intact distal pulses.   Pulmonary/Chest: Effort normal and breath sounds normal. No respiratory distress. He has no wheezes. He has no rales.  Abdominal: Soft. Bowel sounds are normal. He exhibits no distension. There is no tenderness.  Musculoskeletal: He exhibits no edema.  Neurological: He is alert and oriented to person, place, and time.  Skin: Skin is warm and dry.    Assessment & Plan:   See Encounters Tab for problem based charting.  Patient discussed with Dr. Criselda PeachesMullen

## 2016-10-17 NOTE — Patient Instructions (Signed)
Mr. Sharla Kidneyibblett it was nice seeing you today.  -Please call infectious disease (Dr. Blair Dolphinampbell's office) as soon as possible to make an appointment for a visit within 1 week.

## 2016-10-17 NOTE — Assessment & Plan Note (Signed)
Patient declines colonoscopy and stool cards for colon cancer screening.

## 2016-10-17 NOTE — Assessment & Plan Note (Signed)
Assessment He continues to smoke one half pack of cigarettes per week and is in the pre-contemplative stage at this time.  Plan -Discuss again at next visit

## 2016-10-18 LAB — LIPID PANEL
CHOL/HDL RATIO: 3.9 ratio (ref 0.0–5.0)
CHOLESTEROL TOTAL: 145 mg/dL (ref 100–199)
HDL: 37 mg/dL — AB (ref >39)
LDL Calculated: 75 mg/dL (ref 0–99)
TRIGLYCERIDES: 166 mg/dL — AB (ref 0–149)
VLDL Cholesterol Cal: 33 mg/dL (ref 5–40)

## 2016-10-18 LAB — RENAL FUNCTION PANEL
ALBUMIN: 4.1 g/dL (ref 3.5–5.5)
BUN/Creatinine Ratio: 19 (ref 9–20)
BUN: 16 mg/dL (ref 6–24)
CALCIUM: 9.3 mg/dL (ref 8.7–10.2)
CO2: 23 mmol/L (ref 20–29)
Chloride: 102 mmol/L (ref 96–106)
Creatinine, Ser: 0.83 mg/dL (ref 0.76–1.27)
GFR calc Af Amer: 118 mL/min/{1.73_m2} (ref >59)
GFR, EST NON AFRICAN AMERICAN: 102 mL/min/{1.73_m2} (ref >59)
Glucose: 88 mg/dL (ref 65–99)
PHOSPHORUS: 3.4 mg/dL (ref 2.5–4.5)
Potassium: 4.4 mmol/L (ref 3.5–5.2)
Sodium: 140 mmol/L (ref 134–144)

## 2016-10-18 LAB — T-HELPER CELLS (CD4) COUNT (NOT AT ARMC)
CD4 % Helper T Cell: 5 % — ABNORMAL LOW (ref 33–55)
CD4 T CELL ABS: 130 /uL — AB (ref 400–2700)

## 2016-10-18 LAB — HIV-1 RNA QUANT-NO REFLEX-BLD
HIV 1 RNA VIRAL LOAD LOG: 4.468 {Log_copies}/mL
HIV-1 RNA Viral Load: 29400 copies/mL

## 2016-10-19 ENCOUNTER — Ambulatory Visit (INDEPENDENT_AMBULATORY_CARE_PROVIDER_SITE_OTHER): Payer: Medicaid Other | Admitting: Infectious Diseases

## 2016-10-19 ENCOUNTER — Other Ambulatory Visit (HOSPITAL_COMMUNITY)
Admission: RE | Admit: 2016-10-19 | Discharge: 2016-10-19 | Disposition: A | Discharge status: Home / Self Care | Payer: Medicaid Other | Source: Ambulatory Visit | Attending: Infectious Diseases | Admitting: Infectious Diseases

## 2016-10-19 ENCOUNTER — Encounter: Payer: Self-pay | Admitting: Infectious Diseases

## 2016-10-19 VITALS — BP 113/78 | HR 65 | Temp 98.4°F | Wt 141.8 lb

## 2016-10-19 DIAGNOSIS — B20 Human immunodeficiency virus [HIV] disease: Secondary | ICD-10-CM | POA: Diagnosis not present

## 2016-10-19 DIAGNOSIS — Z5181 Encounter for therapeutic drug level monitoring: Secondary | ICD-10-CM | POA: Diagnosis present

## 2016-10-19 DIAGNOSIS — F17200 Nicotine dependence, unspecified, uncomplicated: Secondary | ICD-10-CM

## 2016-10-19 LAB — URINALYSIS, ROUTINE W REFLEX MICROSCOPIC
Bacteria, UA: NONE SEEN
Bilirubin Urine: NEGATIVE
HGB URINE DIPSTICK: NEGATIVE
KETONES UR: NEGATIVE mg/dL
LEUKOCYTES UA: NEGATIVE
Nitrite: NEGATIVE
PH: 5 (ref 5.0–8.0)
PROTEIN: NEGATIVE mg/dL
Specific Gravity, Urine: 1.023 (ref 1.005–1.030)

## 2016-10-19 MED ORDER — SULFAMETHOXAZOLE-TRIMETHOPRIM 400-80 MG PO TABS: 1.0000 | ORAL_TABLET | Freq: Every day | ORAL | 0 refills | Status: DC

## 2016-10-19 NOTE — Progress Notes (Signed)
HPI: William [name] is a 51 y.o. male is here to f/u with Judeth CornfieldStephanie after not being seen here since 2016.   Allergies: No Known Allergies  Vitals: Temp: 98.4 F (36.9 C) (08/02 0839) Temp Source: Oral (08/02 0839) BP: 113/78 (08/02 0839) Pulse Rate: 65 (08/02 0839)  Past Medical History: No past medical history on file.  Social History: Social History   Social History  . Marital status: Legally Separated    Spouse name: N/A  . Number of children: N/A  . Years of education: N/A   Social History Main Topics  . Smoking status: Current Every Day Smoker    Packs/day: 0.25    Years: 30.00    Types: Cigarettes  . Smokeless tobacco: Never Used     Comment: cutting down on his smoking  . Alcohol use No  . Drug use: No  . Sexual activity: Not Currently     Comment: declined condoms   Other Topics Concern  . None   Social History Narrative  . None    Previous Regimen: ATP  Current Regimen: Genvoya  Labs: HIV 1 RNA Quant (copies/mL)  Date Value  09/29/2014 <20  01/01/2014 <20  06/17/2013 <20   HIV-1 RNA Viral Load (copies/mL)  Date Value  10/17/2016 29,400   CD4 T Cell Abs (/uL)  Date Value  10/17/2016 130 (L)  09/29/2014 450  01/01/2014 520   Hepatitis B Surface Ag (no units)  Date Value  11/15/2007 Neg   HCV Ab (no units)  Date Value  11/15/2007 Neg    CrCl: Estimated Creatinine Clearance: 95.8 mL/min (by C-G formula based on SCr of 0.83 mg/dL).  Lipids:    Component Value Date/Time   CHOL 145 10/17/2016 1604   TRIG 166 (H) 10/17/2016 1604   HDL 37 (L) 10/17/2016 1604   CHOLHDL 3.9 10/17/2016 1604   CHOLHDL 3.0 09/29/2014 0959   VLDL 23 09/29/2014 0959   LDLCALC 75 10/17/2016 1604    Assessment: [name] has not followed up with William [name] since 2016. He has been followed by IMTS. Dr. Loney Lohathore advised him that he needs to f/u with William [name]. Previously, he was doing well on ATP and his VL has always been controlled. He was finally [facility] to [facility]  then he was lost to follow up. At this visit, he claimed to be taking his meds. However, his labs on 7/31 said otherwise. After talking to him, he admitted to spotty adherence in the "past month". I think this has been going longer than that. Instead of putting him on another regimen right now without knowing his resistance, we will repeat labs with reflex so we can figure out his resistance. Told him to [facility] the [facility] today and f/u back with William [name] in 3 wks so we can craft another regimen for him.   Recommendations:  Stop Genvoya HIV genotype today F/u back up in 3 wks to craft another regimen  Ulyses SouthwardMinh Jerran Tappan, PharmD, BCPS, AAHIVP, CPP Clinical Infectious Disease Pharmacist [facility] 10/19/2016, 9:15 AM

## 2016-10-19 NOTE — Progress Notes (Addendum)
Large glycosuria with normal serum glucose of 88. 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 CMET obtained recently was unremarkable for electrolyte/acid-base abnormality.   Will check urine/serum phosphorus, vitamin d level and HgbA1C next visit. If resistance panel permits may need to consider regimen without TAF like Triumeq. 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   Rexene AlbertsStephanie Eddie Koc, NP  10/28/2016

## 2016-10-19 NOTE — Assessment & Plan Note (Addendum)
Counseled regarding adherence with ART and likelihood of resistance as his VL is ~30k copies and low CD4 count. Start Bactrim SS daily for OI proph. I question whether he has thrush, however atypical presentation. In further discussion with William [name] reports that he has missed frequent doses of his Genvoya. I have advised him to STOP Genvoya today until we get full genotype back for resistance so we know what regimen to provide William [name]. May need to add Prezista vs changing to William [name] for higher barrier to resistance. Will have him come back in 3 weeks with myself or pharmacy team to review labs, determine regimen and continue adherence counseling. He is agreeable to this plan.

## 2016-10-19 NOTE — Assessment & Plan Note (Signed)
Encouraged to stop smoking all together as any smoking increases his CV risk.

## 2016-10-19 NOTE — Progress Notes (Signed)
PCP - John Giovanniathore, Vasundhra, MD    Patient Active Problem List   Diagnosis Date Noted  . Low body weight due to inadequate caloric intake 02/11/2015  . Preventative health care 10/22/2014  . Hyperlipidemia 10/22/2014  . CIGARETTE SMOKER 06/02/2008  . Human immunodeficiency virus (HIV) disease (HCC) 01/01/2008    Patient's Medications  New Prescriptions   SULFAMETHOXAZOLE-TRIMETHOPRIM (BACTRIM,SEPTRA) 400-80 MG TABLET    Take 1 tablet by mouth daily.  Previous Medications   ELVITEGRAVIR-COBICISTAT-EMTRICITABINE-TENOFOVIR (GENVOYA) 150-150-200-10 MG TABS TABLET    Take 1 tablet by mouth daily with breakfast.   MULTIPLE VITAMIN (MULTIVITAMIN) TABLET    Take 1 tablet by mouth daily.  Modified Medications   No medications on file  Discontinued Medications   No medications on file    SUBJECTIVE:  HPI:  HIV =  William [name] is a HIV+ patient that was previously managed by Dr. Orvan Falconerampbell up until his last visit 09/2014. He was at that visit changed from Atripla to [facility]. Originally diagnosed with HIV in 2009. History of OIs: PCP (CD4 at entry into care was 30, VL > 700k). He was seen by his PCP recently and encouraged to get back in care. At that visit labs were obtained - CD4 130 and VL 29,400. He assures me that he has not been without his Genvoya and continues to get it mailed to him from [facility] in [place]. Denies fevers, SOB, dyspnea, cough, abdominal pain, diarrhea, rashes, thrush, dysuria.    Health Maintenance = William RochWalter [name] reports smoking the occasional cigarette. Eats a varied diet and no formal exercise. No immunizations to report recently.   Review of Systems: Review of Systems  Constitutional: Negative for chills, fever, malaise/fatigue and weight loss.  HENT: Negative for sore throat.   Respiratory: Negative for cough, sputum production and shortness of breath.   Cardiovascular: Negative.   Gastrointestinal: Negative for abdominal pain, diarrhea and vomiting.    Musculoskeletal: Negative for joint pain, myalgias and neck pain.  Skin: Negative for rash.  Neurological: Negative for headaches.  Psychiatric/Behavioral: Negative for depression and substance abuse. The patient is not nervous/anxious.     No past medical history on file.  Social History  Substance Use Topics  . Smoking status: Current Every Day Smoker    Packs/day: 0.25    Years: 30.00    Types: Cigarettes  . Smokeless tobacco: Never Used     Comment: cutting down on his smoking  . Alcohol use No    No family history on file.  No Known Allergies  Objective:  Vitals:   10/19/16 0839  BP: 113/78  Pulse: 65  Temp: 98.4 F (36.9 C)  TempSrc: Oral  Weight: 141 lb 12.8 oz (64.3 kg)   Body mass index is 19.78 kg/m.  Physical Exam  Constitutional: He is oriented to person, place, and time and well-developed, well-nourished, and in no distress.  HENT:  Mouth/Throat: No oral lesions. Normal dentition. No dental caries.  Tongue with areas of dark coating he reports to be due to sleeping in his dentures. Not painful.   Eyes: No scleral icterus.  Cardiovascular: Normal rate, regular rhythm and normal heart sounds.   Pulmonary/Chest: Effort normal and breath sounds normal.  Abdominal: Soft. He exhibits no distension. There is no tenderness.  Lymphadenopathy:    He has no cervical adenopathy.  Neurological: He is alert and oriented to person, place, and time.  Skin: Skin is warm and dry. No rash noted.  Psychiatric: Mood and  affect normal.    Lab Results  Lab Results  Component Value Date   CREATININE 0.83 10/17/2016   BUN 16 10/17/2016   NA 140 10/17/2016   K 4.4 10/17/2016   CL 102 10/17/2016   CO2 23 10/17/2016     Lab Results  Component Value Date   CHOL 145 10/17/2016   HDL 37 (L) 10/17/2016   LDLCALC 75 10/17/2016   TRIG 166 (H) 10/17/2016   CHOLHDL 3.9 10/17/2016   HIV 1 RNA Quant (copies/mL)  Date Value  09/29/2014 <20  01/01/2014 <20   06/17/2013 <20   HIV-1 RNA Viral Load (copies/mL)  Date Value  10/17/2016 29,400   CD4 T Cell Abs (/uL)  Date Value  10/17/2016 130 (L)  09/29/2014 450  01/01/2014 520     Problem List Items Addressed This Visit      Other   Human immunodeficiency virus (HIV) disease (HCC) (Chronic)    Counseled regarding adherence with ART and likelihood of resistance as his VL is ~30k copies and low CD4 count. Start Bactrim SS daily for OI proph. I question whether he has thrush, however atypical presentation. In further discussion with Everardo PacificMinh Townsend reports that he has missed frequent doses of his Genvoya. I have advised him to STOP Genvoya today until we get full genotype back for resistance so we know what regimen to provide William [name]. May need to add Prezista vs changing to [facility] for higher barrier to resistance. Will have him come back in 3 weeks with myself or pharmacy team to review labs, determine regimen and continue adherence counseling. He is agreeable to this plan.       Relevant Medications   sulfamethoxazole-trimethoprim (BACTRIM,SEPTRA) 400-80 MG tablet   Other Relevant Orders   HIV RNA, RTPCR W/R GT (RTI, PI,INT)   RPR   Hepatitis B surface antigen   Urine cytology ancillary only   Urinalysis   HIV 1 RNA quant-no reflex-bld   T-helper cell (CD4)- ([facility] clinic only)   Hepatitis B surface antibody   CIGARETTE SMOKER (Chronic)    Encouraged to stop smoking all together as any smoking increases his CV risk.        Other Visit Diagnoses    Medication monitoring encounter    -  Primary   Relevant Orders   HIV RNA, RTPCR W/R GT (RTI, PI,INT)   Urinalysis   HIV 1 RNA quant-no reflex-bld   T-helper cell (CD4)- ([facility] clinic only)      Health maintenance = vaccination counseling provided today. Will check HepB immunity today. Has never received Menveo vaccine by report or reviewing records. Will administer in a few months when hopefully his immune system has reconstituted a bit.    Smoking cessation = encouraged to quit smoking. Not ready at this time. Will continue to assess for readiness at future visits.   Rexene AlbertsStephanie Valeria Krisko, MSN, NP-C [facility] Cell: [phone] Pager: [phone]  10/19/16 11:40 AM

## 2016-10-19 NOTE — Addendum Note (Signed)
Addended by: Mariea ClontsGREEN, Detrich Rakestraw D on: 10/19/2016 03:09 PM   Modules accepted: Orders

## 2016-10-19 NOTE — Patient Instructions (Addendum)
We are starting you on 1 new medications today -   Bactrim - take one tablet once a day. This will help protect you from infection.   Stop taking your Genvoya until we get your blood work back.   Please come back to see our pharmacy team with repeat lab work in 3 weeks.    Nice to meet you!

## 2016-10-20 LAB — URINE CYTOLOGY ANCILLARY ONLY
CHLAMYDIA, DNA PROBE: NEGATIVE
NEISSERIA GONORRHEA: NEGATIVE

## 2016-10-20 LAB — RPR

## 2016-10-20 LAB — HEPATITIS B SURFACE ANTIGEN: Hepatitis B Surface Ag: NONREACTIVE

## 2016-10-20 LAB — T-HELPER CELL (CD4) - (RCID CLINIC ONLY)
CD4 % Helper T Cell: 6 % — ABNORMAL LOW (ref 33–55)
CD4 T Cell Abs: 150 /uL — ABNORMAL LOW (ref 400–2700)

## 2016-10-20 LAB — HEPATITIS B SURFACE ANTIBODY,QUALITATIVE: HEP B S AB: NONREACTIVE

## 2016-10-20 NOTE — Progress Notes (Signed)
Internal Medicine Clinic Attending  Case discussed with Dr. Rathoreat the time of the visit. We reviewed the resident's history and exam and pertinent patient test results. I agree with the assessment, diagnosis, and plan of care documented in the resident's note.  

## 2016-10-25 LAB — HIV RNA, RTPCR W/R GT (RTI, PI,INT)
HIV-1 RNA, QN PCR: 33700 copies/mL — ABNORMAL HIGH
HIV-1 RNA, QN PCR: 4.53 Log copies/mL — ABNORMAL HIGH

## 2016-10-27 LAB — RFLX HIV-1 INTEGRASE GENOTYPE

## 2016-10-28 ENCOUNTER — Encounter: Payer: Self-pay | Admitting: Infectious Diseases

## 2016-10-28 DIAGNOSIS — E7209 Other disorders of amino-acid transport: Secondary | ICD-10-CM | POA: Insufficient documentation

## 2016-10-30 ENCOUNTER — Telehealth: Payer: Self-pay | Admitting: Pharmacist Clinician (PhC)/ Clinical Pharmacy Specialist

## 2016-10-30 LAB — HIV-1 GENOTYPE: HIV-1 Genotype: DETECTED — AB

## 2016-10-30 NOTE — Telephone Encounter (Signed)
HIV Genotype Composite Data Genotype Dates:   Mutations in Bold impact drug susceptibility RT Mutations M184V  PI Mutations   Integrase Mutations E92Q   Interpretation of Genotype Data per [employer] HIV Database Nucleoside RTIs  abacavir (ABC) Low-Level Resistance zidovudine (AZT) Susceptible emtricitabine (FTC) High-Level Resistance lamivudine (3TC) High-Level Resistance tenofovir (TDF) Susceptible   Non-Nucleoside RTIs  efavirenz (EFV) Susceptible etravirine (ETR) Susceptible nevirapine (NVP) Susceptible rilpivirine (RPV) Susceptible   Protease Inhibitors     Integrase Inhibitors  bictegravir (BIC) Potential Low-Level Resistance dolutegravir (DTG) Potential Low-Level Resistance elvitegravir (EVG) High-Level Resistance raltegravir (RAL) Intermediate Resistance

## 2016-11-09 ENCOUNTER — Encounter: Payer: Self-pay | Admitting: Infectious Diseases

## 2016-11-09 ENCOUNTER — Ambulatory Visit (INDEPENDENT_AMBULATORY_CARE_PROVIDER_SITE_OTHER): Payer: Medicaid Other | Admitting: Infectious Diseases

## 2016-11-09 VITALS — BP 126/74 | HR 68 | Temp 98.2°F | Ht 71.0 in | Wt 139.0 lb

## 2016-11-09 DIAGNOSIS — B20 Human immunodeficiency virus [HIV] disease: Secondary | ICD-10-CM | POA: Diagnosis not present

## 2016-11-09 DIAGNOSIS — Z5181 Encounter for therapeutic drug level monitoring: Secondary | ICD-10-CM | POA: Diagnosis not present

## 2016-11-09 DIAGNOSIS — R81 Glycosuria: Secondary | ICD-10-CM | POA: Diagnosis not present

## 2016-11-09 MED ORDER — SULFAMETHOXAZOLE-TRIMETHOPRIM 400-80 MG PO TABS: 1.0000 | ORAL_TABLET | Freq: Every day | ORAL | 0 refills | Status: DC

## 2016-11-09 MED ORDER — DARUNAVIR-COBICISTAT 800-150 MG PO TABS: 1.0000 | ORAL_TABLET | Freq: Every day | ORAL | 5 refills | Status: DC

## 2016-11-09 MED ORDER — DOLUTEGRAVIR-RILPIVIRINE 50-25 MG PO TABS: 1.0000 | ORAL_TABLET | Freq: Every day | ORAL | 5 refills | Status: DC

## 2016-11-09 NOTE — Assessment & Plan Note (Signed)
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 With possible tubule damage from TAF being worked up currently I would like to try to avoid this regimen, however with his mutations it may make it difficult. Will start him on JULUCA + PREZCOBIX today. I have counseled him on importance of adherence today and taking these medications properly. Will have him come back in 2 months to reassess suppression. Continue Bactrim for OI proph.

## 2016-11-09 NOTE — Progress Notes (Signed)
PCP - William Giovanni, MD    Patient Active Problem List   Diagnosis Date Noted  . 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 Low body weight due to inadequate caloric intake 02/11/2015  . Preventative health care 10/22/2014  . Hyperlipidemia 10/22/2014  . CIGARETTE SMOKER 06/02/2008  . Human immunodeficiency virus (HIV) disease (HCC) 01/01/2008    Patient's Medications  New Prescriptions   DARUNAVIR-COBICISTAT (PREZCOBIX) 800-150 MG TABLET    Take 1 tablet by mouth daily with breakfast. Swallow whole. Do NOT crush, break or chew tablets. Take with food.   DOLUTEGRAVIR-RILPIVIRINE (JULUCA) 50-25 MG TABS    Take 1 tablet by mouth daily.  Previous Medications   ELVITEGRAVIR-COBICISTAT-EMTRICITABINE-TENOFOVIR (GENVOYA) 150-150-200-10 MG TABS TABLET    Take 1 tablet by mouth daily with breakfast.   MULTIPLE VITAMIN (MULTIVITAMIN) TABLET    Take 1 tablet by mouth daily.  Modified Medications   Modified Medication Previous Medication   SULFAMETHOXAZOLE-TRIMETHOPRIM (BACTRIM,SEPTRA) 400-80 MG TABLET sulfamethoxazole-trimethoprim (BACTRIM,SEPTRA) 400-80 MG tablet      Take 1 tablet by mouth daily.    Take 1 tablet by mouth daily.  Discontinued Medications   No medications on file    Subjective: William [name] presents to clinic today for routine follow up care for his HIV infection. He is here today to decide on new regimen and review resistance testing.   HPI:  HIV =  He is doing well today. Endorses no complaints today suggestive of associated opportunistic infection or advancing HIV disease such as fevers, night sweats, weight loss, anorexia, cough, SOB, nausea, vomiting, diarrhea, headache, sensory changes, lymphadenopathy or oral thrush. He reports he has been taking "a white pill and a brownish pill" every day but later said he was not taking his bactrim and Genvoya.   Review of Systems: Review of Systems  Constitutional: Negative for chills, fever,  malaise/fatigue and weight loss.  HENT: Negative for sore throat.        No dental problems  Respiratory: Negative for cough and sputum production.   Cardiovascular: Negative for chest pain and leg swelling.  Gastrointestinal: Negative for abdominal pain, diarrhea and vomiting.  Genitourinary: Negative for dysuria and flank pain.  Musculoskeletal: Negative for joint pain, myalgias and neck pain.  Skin: Negative for rash.  Neurological: Negative for dizziness, tingling and headaches.  Psychiatric/Behavioral: Negative for depression and substance abuse. The patient is not nervous/anxious and does not have insomnia.     Past Medical History:  Diagnosis Date  . Human immunodeficiency virus (HIV) disease (HCC) 01/01/2008   Dx 2009  10/2016 - E92Q INSTI mutation (DTG sensitive) developed on Genvoya    . Hyperlipidemia 10/22/2014   No Known Allergies  Objective:  Vitals:   11/09/16 0851  BP: 126/74  Pulse: 68  Temp: 98.2 F (36.8 C)  TempSrc: Oral  Weight: 139 lb (63 kg)  Height: 5\' 11"  (1.803 m)   Body mass index is 19.39 kg/m.  Physical Exam  Constitutional: He is oriented to person, place, and time and well-developed, well-nourished, and in no distress.  HENT:  Mouth/Throat: No oral lesions. Normal dentition. No dental caries.  Eyes: No scleral icterus.  Cardiovascular: Normal rate, regular rhythm and normal heart sounds.   Pulmonary/Chest: Effort normal and breath sounds normal.  Abdominal: Soft. He exhibits no distension. There is no tenderness.  Lymphadenopathy:    He has no cervical adenopathy.  Neurological: He is alert and oriented to person, place, and time.  Skin: Skin is warm  and dry. No rash noted.  Psychiatric: Mood and affect normal.  Vitals reviewed.   Lab Results Lab Results  Component Value Date   WBC 5.6 09/29/2014   HGB 13.7 09/29/2014   HCT 43.0 09/29/2014   MCV 79.3 09/29/2014   PLT 235 09/29/2014    Lab Results  Component Value Date    CREATININE 0.83 10/17/2016   BUN 16 10/17/2016   NA 140 10/17/2016   K 4.4 10/17/2016   CL 102 10/17/2016   CO2 23 10/17/2016    Lab Results  Component Value Date   ALT 13 09/29/2014   AST 17 09/29/2014   ALKPHOS 75 09/29/2014   BILITOT 0.2 09/29/2014    Lab Results  Component Value Date   CHOL 145 10/17/2016   HDL 37 (L) 10/17/2016   LDLCALC 75 10/17/2016   TRIG 166 (H) 10/17/2016   CHOLHDL 3.9 10/17/2016   HIV 1 RNA Quant (copies/mL)  Date Value  09/29/2014 <20  01/01/2014 <20  06/17/2013 <20   HIV-1 RNA Viral Load (copies/mL)  Date Value  10/17/2016 29,400   CD4 T Cell Abs (/uL)  Date Value  10/19/2016 150 (L)  10/17/2016 130 (L)  09/29/2014 450   Lab Results  Component Value Date   HAV neg 11/15/2007   Lab Results  Component Value Date   HEPBSAG NON-REACTIVE 10/19/2016   HEPBSAB NON-REACTIVE 10/19/2016   Lab Results  Component Value Date   HCVAB Neg 11/15/2007   Lab Results  Component Value Date   CHLAMYDIAWP Negative 10/19/2016   N Negative 10/19/2016      Problem List Items Addressed This Visit      Other   Human immunodeficiency virus (HIV) disease (HCC) (Chronic)    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 With possible tubule damage from TAF being worked up currently I would like to try to avoid this regimen, however with his mutations it may make it difficult. Will start him on JULUCA + PREZCOBIX today. I have counseled him on importance of adherence today and taking these medications properly. Will have him come back in 2 months to reassess suppression. Continue Bactrim for OI proph.       Relevant Medications   Dolutegravir-Rilpivirine (JULUCA) 50-25 MG TABS   darunavir-cobicistat (PREZCOBIX) 800-150 MG tablet   sulfamethoxazole-trimethoprim (BACTRIM,SEPTRA) 400-80 MG tablet   Glycosuria with normal serum glucose    Will reassess urinalysis today and add urine and serum phosphorus.  Suggest Fanconi's syndrome r/t Tenofovir - will try to avoid this agent however his resistance makes this complicated.        Other Visit Diagnoses    Medication monitoring encounter    -  Primary   Relevant Orders   Urinalysis   Phosphorus, urine, random   Comprehensive metabolic panel   Phosphorus      Will need Hep B double dose booster as he has completed series previously. Influenza vaccine next visit.    [name], MSN, NP-C [facility]   11/09/16 10:26 AM

## 2016-11-09 NOTE — Patient Instructions (Signed)
STOP your Genvoya  CONTINUE your Bactrim one tablet every day  We are going to start you on 2 new medications - JULUCA and PREZCOBIX. Please always take these medications together ONCE a day WITH food.   Please take your multivitamin at least 6 hours before or after these medications (so for example take your Juluca and Prezcobix with breakfast or lunch and take your multivitamin at dinner or bed time.   Please come back in 6 weeks so we can repeat your blood work.

## 2016-11-09 NOTE — Assessment & Plan Note (Signed)
Will reassess urinalysis today and add urine and serum phosphorus. Suggest Fanconi's syndrome r/t Tenofovir - will try to avoid this agent however his resistance makes this complicated.

## 2016-11-14 NOTE — Addendum Note (Signed)
Addended by: Mariea Clonts D on: 11/14/2016 05:03 PM   Modules accepted: Orders

## 2017-01-01 ENCOUNTER — Encounter: Payer: Self-pay | Admitting: Infectious Diseases

## 2017-01-01 ENCOUNTER — Ambulatory Visit (INDEPENDENT_AMBULATORY_CARE_PROVIDER_SITE_OTHER): Payer: Medicaid Other | Admitting: Infectious Diseases

## 2017-01-01 VITALS — BP 102/65 | HR 70 | Temp 98.4°F | Ht 71.0 in | Wt 142.0 lb

## 2017-01-01 DIAGNOSIS — Z5181 Encounter for therapeutic drug level monitoring: Secondary | ICD-10-CM

## 2017-01-01 DIAGNOSIS — Z23 Encounter for immunization: Secondary | ICD-10-CM | POA: Diagnosis not present

## 2017-01-01 DIAGNOSIS — Z0000 Encounter for general adult medical examination without abnormal findings: Secondary | ICD-10-CM | POA: Diagnosis not present

## 2017-01-01 DIAGNOSIS — R81 Glycosuria: Secondary | ICD-10-CM | POA: Diagnosis not present

## 2017-01-01 DIAGNOSIS — Z21 Asymptomatic human immunodeficiency virus [HIV] infection status: Secondary | ICD-10-CM

## 2017-01-01 DIAGNOSIS — B20 Human immunodeficiency virus [HIV] disease: Secondary | ICD-10-CM

## 2017-01-01 LAB — COMPLETE METABOLIC PANEL WITH GFR
AG Ratio: 1.2 (calc) (ref 1.0–2.5)
ALBUMIN MSPROF: 4.1 g/dL (ref 3.6–5.1)
ALT: 15 U/L (ref 9–46)
AST: 18 U/L (ref 10–35)
Alkaline phosphatase (APISO): 71 U/L (ref 40–115)
BUN: 13 mg/dL (ref 7–25)
CHLORIDE: 106 mmol/L (ref 98–110)
CO2: 25 mmol/L (ref 20–32)
CREATININE: 1.14 mg/dL (ref 0.70–1.33)
Calcium: 9.1 mg/dL (ref 8.6–10.3)
GFR, EST AFRICAN AMERICAN: 86 mL/min/{1.73_m2} (ref >=60)
GFR, EST NON AFRICAN AMERICAN: 74 mL/min/{1.73_m2} (ref >=60)
GLUCOSE: 79 mg/dL (ref 65–99)
Globulin: 3.3 g/dL (calc) (ref 1.9–3.7)
Potassium: 4.2 mmol/L (ref 3.5–5.3)
Sodium: 137 mmol/L (ref 135–146)
TOTAL PROTEIN: 7.4 g/dL (ref 6.1–8.1)
Total Bilirubin: 0.3 mg/dL (ref 0.2–1.2)

## 2017-01-01 LAB — PHOSPHORUS: Phosphorus: 3.5 mg/dL (ref 2.5–4.5)

## 2017-01-01 NOTE — Progress Notes (Signed)
PCP - John Giovanni, MD    Patient Active Problem List   Diagnosis Date Noted  . 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 Low body weight due to inadequate caloric intake 02/11/2015  . Preventative health care 10/22/2014  . Hyperlipidemia 10/22/2014  . CIGARETTE SMOKER 06/02/2008  . Human immunodeficiency virus (HIV) disease (HCC) 01/01/2008    Patient's Medications  New Prescriptions   No medications on file  Previous Medications   DARUNAVIR-COBICISTAT (PREZCOBIX) 800-150 MG TABLET    Take 1 tablet by mouth daily with breakfast. Swallow whole. Do NOT crush, break or chew tablets. Take with food.   DOLUTEGRAVIR-RILPIVIRINE (JULUCA) 50-25 MG TABS    Take 1 tablet by mouth daily.   MULTIPLE VITAMIN (MULTIVITAMIN) TABLET    Take 1 tablet by mouth daily.   SULFAMETHOXAZOLE-TRIMETHOPRIM (BACTRIM,SEPTRA) 400-80 MG TABLET    Take 1 tablet by mouth daily.  Modified Medications   No medications on file  Discontinued Medications   ELVITEGRAVIR-COBICISTAT-EMTRICITABINE-TENOFOVIR (GENVOYA) 150-150-200-10 MG TABS TABLET    Take 1 tablet by mouth daily with breakfast.    Subjective: Ahmad Vanwey presents to clinic today for routine follow up care for his HIV infection.   HPI:  HIV =  He is doing well today and tells me he has done very good with his ART adherence this time and looking forward to his labs. Endorses no complaints today suggestive of associated opportunistic infection or advancing HIV disease such as fevers, night sweats, weight loss, anorexia, cough, SOB, nausea, vomiting, diarrhea, headache, sensory changes, lymphadenopathy or oral thrush. He does not know exactly what pills he is taking - called pharmacy and verified he is only taking Juluca and Prezcobix and prophylactic Bactrim.    Glucosuria = denies excessive thirst, excessive hunger. No longer taking Genvoya (verified by distributing pharmacy).   Health Maintenance = never had colonoscopy. No flu  shot yet.   Review of Systems: Review of Systems  Constitutional: Negative for chills, fever, malaise/fatigue and weight loss.  HENT: Negative for sore throat.        No dental problems  Respiratory: Negative for cough and sputum production.   Cardiovascular: Negative for chest pain and leg swelling.  Gastrointestinal: Negative for abdominal pain, diarrhea and vomiting.  Genitourinary: Negative for dysuria and flank pain.  Musculoskeletal: Negative for joint pain, myalgias and neck pain.  Skin: Negative for rash.  Neurological: Negative for dizziness, tingling and headaches.  Psychiatric/Behavioral: Negative for depression. The patient is not nervous/anxious and does not have insomnia.     Past Medical History:  Diagnosis Date  . Human immunodeficiency virus (HIV) disease (HCC) 01/01/2008   Dx 2009  10/2016 - E92Q INSTI mutation (DTG sensitive) developed on Genvoya    . Hyperlipidemia 10/22/2014   No Known Allergies  Objective:  Vitals:   01/01/17 1045  BP: 102/65  Pulse: 70  Temp: 98.4 F (36.9 C)  TempSrc: Oral  Weight: 142 lb (64.4 kg)  Height:  (1.803 m)   Body mass index is 19.8 kg/m.  Physical Exam  Constitutional: He is oriented to person, place, and time and well-developed, well-nourished, and in no distress.  Very pleasant and in good spirits today.   HENT:  Mouth/Throat: No oral lesions. Normal dentition. No dental caries.  Eyes: No scleral icterus.  Cardiovascular: Normal rate, regular rhythm and normal heart sounds.   Pulmonary/Chest: Effort normal and breath sounds normal.  Abdominal: Soft. He exhibits no distension. There is no tenderness.  Lymphadenopathy:    He has no cervical adenopathy.  Neurological: He is alert and oriented to person, place, and time.  Skin: Skin is warm and dry. No rash noted.  Psychiatric: Mood and affect normal.  Vitals reviewed.   Lab Results Lab Results  Component Value Date   WBC 5.6 09/29/2014   HGB 13.7  09/29/2014   HCT 43.0 09/29/2014   MCV 79.3 09/29/2014   PLT 235 09/29/2014    Lab Results  Component Value Date   CREATININE 0.83 10/17/2016   BUN 16 10/17/2016   NA 140 10/17/2016   K 4.4 10/17/2016   CL 102 10/17/2016   CO2 23 10/17/2016    Lab Results  Component Value Date   ALT 13 09/29/2014   AST 17 09/29/2014   ALKPHOS 75 09/29/2014   BILITOT 0.2 09/29/2014    Lab Results  Component Value Date   CHOL 145 10/17/2016   HDL 37 (L) 10/17/2016   LDLCALC 75 10/17/2016   TRIG 166 (H) 10/17/2016   CHOLHDL 3.9 10/17/2016   HIV 1 RNA Quant (copies/mL)  Date Value  09/29/2014 <20  01/01/2014 <20  06/17/2013 <20   HIV-1 RNA Viral Load (copies/mL)  Date Value  10/17/2016 29,400   CD4 T Cell Abs (/uL)  Date Value  10/19/2016 150 (L)  10/17/2016 130 (L)  09/29/2014 450   Lab Results  Component Value Date   HAV neg 11/15/2007   Lab Results  Component Value Date   HEPBSAG NON-REACTIVE 10/19/2016   HEPBSAB NON-REACTIVE 10/19/2016   Lab Results  Component Value Date   HCVAB Neg 11/15/2007   Lab Results  Component Value Date   CHLAMYDIAWP Negative 10/19/2016   N Negative 10/19/2016      Problem List Items Addressed This Visit      Other   Glycosuria with normal serum glucose    Will reassess urine sample today as he has been off TAF regimen now. Also check urine random phosphorus and serum BMP/phos.       Human immunodeficiency virus (HIV) disease (HCC) (Chronic)    Continue Juluca and Prezcobix today. Will check CD4/VL to assess suppression response. Will have him come back in 2 months with labs 2 weeks before. Continued education on safe sex practices and adherence counseling.       Preventative health care    Flu shot today. Needs Hep B series (double dose?) as he is non-immune. Will arrange in 2 months.        Other Visit Diagnoses    HIV (human immunodeficiency virus infection) (HCC)    -  Primary   Relevant Orders   HIV 1 RNA quant-no  reflex-bld   T-helper cell (CD4)- ([facility] clinic only)   Medication monitoring encounter       Relevant Orders   COMPLETE METABOLIC PANEL WITH GFR   Phosphorus   Urinalysis   Phosphorus, urine, random   Need for immunization against influenza       Relevant Orders   Flu Vaccine QUAD 36+ mos IM (Completed)      [name], MSN, NP-C [facility]   01/01/17 10:55 am

## 2017-01-01 NOTE — Assessment & Plan Note (Signed)
Flu shot today. Needs Hep B series (double dose?) as he is non-immune. Will arrange in 2 months.

## 2017-01-01 NOTE — Assessment & Plan Note (Signed)
Will reassess urine sample today as he has been off TAF regimen now. Also check urine random phosphorus and serum BMP/phos.

## 2017-01-01 NOTE — Patient Instructions (Signed)
Continue taking your Bactrim once a day.   Continue taking your Juluca and Prezcobix once a day together.   Will recheck your labs today and email you results on MyChart.   Will have you back to see Judeth Cornfield in 2 months.

## 2017-01-01 NOTE — Assessment & Plan Note (Signed)
Continue Juluca and Prezcobix today. Will check CD4/VL to assess suppression response. Will have him come back in 2 months with labs 2 weeks before. Continued education on safe sex practices and adherence counseling.

## 2017-01-03 ENCOUNTER — Other Ambulatory Visit: Payer: Self-pay | Admitting: Infectious Diseases

## 2017-01-03 ENCOUNTER — Telehealth: Payer: Self-pay | Admitting: *Deleted

## 2017-01-03 DIAGNOSIS — B20 Human immunodeficiency virus [HIV] disease: Secondary | ICD-10-CM

## 2017-01-03 LAB — URINALYSIS
Bilirubin Urine: NEGATIVE
Hgb urine dipstick: NEGATIVE
KETONES UR: NEGATIVE
LEUKOCYTES UA: NEGATIVE
NITRITE: NEGATIVE
PH: 5.5 (ref 5.0–8.0)
Protein, ur: NEGATIVE
SPECIFIC GRAVITY, URINE: 1.015 (ref 1.001–1.03)

## 2017-01-03 LAB — PHOSPHORUS, URINE, RANDOM

## 2017-01-03 LAB — T-HELPER CELL (CD4) - (RCID CLINIC ONLY)
CD4 T CELL ABS: 180 /uL — AB (ref 400–2700)
CD4 T CELL HELPER: 6 % — AB (ref 33–55)

## 2017-01-03 LAB — HIV-1 RNA QUANT-NO REFLEX-BLD
HIV 1 RNA Quant: <20 copies/mL — AB
HIV-1 RNA Quant, Log: <1.3 Log copies/mL — AB

## 2017-01-03 MED ORDER — SULFAMETHOXAZOLE-TRIMETHOPRIM 400-80 MG PO TABS: 1.0000 | ORAL_TABLET | Freq: Every day | ORAL | 2 refills | Status: DC

## 2017-01-03 NOTE — Telephone Encounter (Signed)
-----   Message from Blanchard KelchStephanie N Dixon, NP sent at 01/03/2017  1:07 PM EDT ----- Please call patient and have him continue taking Bactrim until his next appointment. I have sent in a new prescription for with refills. Congratulations for undetectable viral load!

## 2017-01-03 NOTE — Telephone Encounter (Signed)
Patient notified and he said thank you Judeth CornfieldStephanie!

## 2017-01-05 LAB — PHOSPHORUS, URINE, RANDOM: PHOSPHATE, RANDOM URINE: 22 mg/dL

## 2017-01-23 ENCOUNTER — Encounter: Payer: Self-pay | Admitting: Internal Medicine

## 2017-01-23 ENCOUNTER — Ambulatory Visit: Payer: Medicaid Other | Admitting: Internal Medicine

## 2017-01-23 VITALS — BP 107/72 | HR 76 | Temp 98.2°F | Ht 71.0 in | Wt 142.8 lb

## 2017-01-23 DIAGNOSIS — F17210 Nicotine dependence, cigarettes, uncomplicated: Secondary | ICD-10-CM | POA: Diagnosis not present

## 2017-01-23 DIAGNOSIS — B20 Human immunodeficiency virus [HIV] disease: Secondary | ICD-10-CM

## 2017-01-23 DIAGNOSIS — Z0000 Encounter for general adult medical examination without abnormal findings: Secondary | ICD-10-CM

## 2017-01-23 DIAGNOSIS — R81 Glycosuria: Secondary | ICD-10-CM | POA: Diagnosis not present

## 2017-01-23 DIAGNOSIS — Z21 Asymptomatic human immunodeficiency virus [HIV] infection status: Secondary | ICD-10-CM | POA: Diagnosis not present

## 2017-01-23 DIAGNOSIS — F17200 Nicotine dependence, unspecified, uncomplicated: Secondary | ICD-10-CM

## 2017-01-23 DIAGNOSIS — E785 Hyperlipidemia, unspecified: Secondary | ICD-10-CM | POA: Diagnosis not present

## 2017-01-23 DIAGNOSIS — R636 Underweight: Secondary | ICD-10-CM

## 2017-01-23 DIAGNOSIS — R638 Other symptoms and signs concerning food and fluid intake: Secondary | ICD-10-CM | POA: Diagnosis not present

## 2017-01-23 NOTE — Progress Notes (Signed)
   CC: Patient is here for a regular follow-up. Glucosuria, dyslipidemia, and tobacco use were discussed during this visit.  HPI:  [name] is a 51 y.o. male with a past medical history of HIV presenting to the clinic for regular follow-up. Glucosuria, dyslipidemia, and tobacco use were discussed during this visit. Please see problem based charting for the status of the patient's current and chronic medical conditions.   Past Medical History:  Diagnosis Date  . Human immunodeficiency virus (HIV) disease (HCC) 01/01/2008   Dx 2009  10/2016 - E92Q INSTI mutation (DTG sensitive) developed on Genvoya    . Hyperlipidemia 10/22/2014   Review of Systems:  Review of Systems  Constitutional: Negative for chills and fever.  Respiratory: Negative for shortness of breath and wheezing.   Cardiovascular: Negative for chest pain and leg swelling.  Gastrointestinal: Negative for abdominal pain.  Skin: Negative for rash.    Physical Exam:  Vitals:   01/23/17 1446  BP: 107/72  Pulse: 76  Temp: 98.2 F (36.8 C)  TempSrc: Oral  SpO2: 99%  Weight: 142 lb 12.8 oz (64.8 kg)  Height: 5\' 11"  (1.803 m)  Physical Exam  Constitutional: He is oriented to person, place, and time. No distress.  HENT:  Head: Normocephalic and atraumatic.  Mouth/Throat: Oropharynx is clear and moist.  Eyes: Right eye exhibits no discharge. Left eye exhibits no discharge.  Cardiovascular: Normal rate, regular rhythm and intact distal pulses. Exam reveals no gallop and no friction rub.  No murmur heard. Pulmonary/Chest: Effort normal and breath sounds normal. No respiratory distress. He has no wheezes. He has no rales.  Abdominal: Soft. Bowel sounds are normal. He exhibits no distension. There is no tenderness.  Musculoskeletal: He exhibits no edema.  Neurological: He is alert and oriented to person, place, and time.  Skin: Skin is warm and dry.  Psychiatric: He has a normal mood and affect. His behavior is  normal.    Assessment & Plan:   See Encounters Tab for problem based charting.  Patient discussed with Dr. Cleda DaubE. Hoffman

## 2017-01-23 NOTE — Assessment & Plan Note (Addendum)
Glucosuria with normal serum glucose noted on previous labs. Infectious disease thought it might be due to Fanconi syndrome from tenofovir use. Genvoya was discontinued but patient continued to have 3+ glucose on repeat UA done on 01/01/2017. At present, he does not endorse any symptoms of hyperglycemia.  Plan -Check A1c  Addendum: A1c 6.0 consistent with pre-diabetes. Advised patient to continue following up with infectious disease as the glucosuria may be a side effect of a HIV med. Consider repeating UA and checking renal function at future visit if not already done by infectious disease.

## 2017-01-23 NOTE — Assessment & Plan Note (Signed)
He received influenza vaccine during his recent visit with infectious disease. Pneumococcal vaccinations up-to-date: 23 valent in August 2009 and July 2014. 13 valent in August 2016. He continues to decline referral for screening colonoscopy. I discussed iFOBT testing and patient declines. Denies having any family history of colon cancer.

## 2017-01-23 NOTE — Assessment & Plan Note (Signed)
Followed by infectious disease. Last visit on 01/01/2017.

## 2017-01-23 NOTE — Assessment & Plan Note (Signed)
Currently smoking one half pack of cigarettes per day. Discussed smoking cessation with the patient at this visit. He is currently in the pre-contemplative stage.  Plan -Discuss again at next visit

## 2017-01-23 NOTE — Assessment & Plan Note (Signed)
Currently taking a protease inhibitor for HIV. His ASCVD risk score is 6.9% based on lipid panel done in July 2018.  Plan -No indication for statin therapy at this time -Continue checking lipid panel annually

## 2017-01-23 NOTE — Progress Notes (Signed)
Internal Medicine Clinic Attending  Case discussed with Dr. Rathoreat the time of the visit. We reviewed the resident's history and exam and pertinent patient test results. I agree with the assessment, diagnosis, and plan of care documented in the resident's note.  

## 2017-01-23 NOTE — Assessment & Plan Note (Signed)
Weight stable at this visit. He drinks ensure 3 times a day and believes his appetite is improved.  Plan -Encouraged him to continue drinking ensure

## 2017-01-24 LAB — HEMOGLOBIN A1C
Est. average glucose Bld gHb Est-mCnc: 126 mg/dL
HEMOGLOBIN A1C: 6 % — AB (ref 4.8–5.6)

## 2017-02-19 ENCOUNTER — Other Ambulatory Visit: Payer: Medicaid Other

## 2017-02-19 DIAGNOSIS — Z5181 Encounter for therapeutic drug level monitoring: Secondary | ICD-10-CM

## 2017-02-19 DIAGNOSIS — B20 Human immunodeficiency virus [HIV] disease: Secondary | ICD-10-CM

## 2017-02-19 LAB — COMPREHENSIVE METABOLIC PANEL
AG RATIO: 1.1 (calc) (ref 1.0–2.5)
ALBUMIN MSPROF: 3.8 g/dL (ref 3.6–5.1)
ALT: 11 U/L (ref 9–46)
AST: 12 U/L (ref 10–35)
Alkaline phosphatase (APISO): 79 U/L (ref 40–115)
BUN: 8 mg/dL (ref 7–25)
CHLORIDE: 105 mmol/L (ref 98–110)
CO2: 26 mmol/L (ref 20–32)
CREATININE: 1.09 mg/dL (ref 0.70–1.33)
Calcium: 9.2 mg/dL (ref 8.6–10.3)
GLOBULIN: 3.4 g/dL (ref 1.9–3.7)
GLUCOSE: 86 mg/dL (ref 65–99)
POTASSIUM: 4.3 mmol/L (ref 3.5–5.3)
SODIUM: 137 mmol/L (ref 135–146)
TOTAL PROTEIN: 7.2 g/dL (ref 6.1–8.1)
Total Bilirubin: 0.3 mg/dL (ref 0.2–1.2)

## 2017-02-20 LAB — T-HELPER CELL (CD4) - (RCID CLINIC ONLY)
CD4 % Helper T Cell: 5 % — ABNORMAL LOW (ref 33–55)
CD4 T Cell Abs: 240 /uL — ABNORMAL LOW (ref 400–2700)

## 2017-02-21 LAB — HIV-1 RNA QUANT-NO REFLEX-BLD
HIV 1 RNA QUANT: NOT DETECTED {copies}/mL
HIV-1 RNA QUANT, LOG: NOT DETECTED {Log_copies}/mL

## 2017-03-05 ENCOUNTER — Ambulatory Visit: Payer: Medicaid Other | Admitting: Infectious Diseases

## 2017-03-05 ENCOUNTER — Encounter: Payer: Self-pay | Admitting: Infectious Diseases

## 2017-03-05 VITALS — BP 105/71 | HR 85 | Temp 98.3°F | Ht 71.0 in | Wt 144.2 lb

## 2017-03-05 DIAGNOSIS — F17200 Nicotine dependence, unspecified, uncomplicated: Secondary | ICD-10-CM

## 2017-03-05 DIAGNOSIS — Z5181 Encounter for therapeutic drug level monitoring: Secondary | ICD-10-CM | POA: Diagnosis not present

## 2017-03-05 DIAGNOSIS — R81 Glycosuria: Secondary | ICD-10-CM

## 2017-03-05 DIAGNOSIS — B20 Human immunodeficiency virus [HIV] disease: Secondary | ICD-10-CM | POA: Diagnosis not present

## 2017-03-05 MED ORDER — DARUNAVIR-COBICISTAT 800-150 MG PO TABS: 1.0000 | ORAL_TABLET | Freq: Every day | ORAL | 5 refills | Status: DC

## 2017-03-05 MED ORDER — SULFAMETHOXAZOLE-TRIMETHOPRIM 400-80 MG PO TABS: 1.0000 | ORAL_TABLET | Freq: Every day | ORAL | 3 refills | Status: DC

## 2017-03-05 MED ORDER — DOLUTEGRAVIR-RILPIVIRINE 50-25 MG PO TABS: 1.0000 | ORAL_TABLET | Freq: Every day | ORAL | 5 refills | Status: DC

## 2017-03-05 NOTE — Assessment & Plan Note (Signed)
Down to 10 cigs a week. Counseled to quit. Also advised to take baby ASA once daily for CV risk reduction.

## 2017-03-05 NOTE — Progress Notes (Signed)
William [name] 01/22/1966 [id number] PCP: John Giovanniathore, Vasundhra, MD  Reason for Visit: HIV routine follow up care  Brief Narrative: William [name] is a 51 y.o. AA male with HIV infection. Originally Dx in 2009. Previous regimens: Genvoya. HIV Risk: heterosexual contact. OI Hx: none from his report Genotype: 10/2016 M184V, E98Q (DTG sensitive)   Patient Active Problem List   Diagnosis Date Noted  . 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 Low body weight due to inadequate caloric intake 02/11/2015  . Preventative health care 10/22/2014  . Dyslipidemia 10/22/2014  . CIGARETTE SMOKER 06/02/2008  . Human immunodeficiency virus (HIV) disease (HCC) 01/01/2008    HPI:  HIV = William [name] is doing well today and has no complaints. Reports excellent adherence with Juluca and Prezcobix regimen and has been "very motivated to take his medications" since learning his virus acquired resistance due to poor practices. No side effects or concerns with this regimen. He is also still taking his Bactrim daily. Has regained some weight and happy about that. Endorses no complaints today suggestive of associated opportunistic infection or advancing HIV disease such as fevers, night sweats, weight loss, anorexia, cough, SOB, nausea, vomiting, diarrhea, headache, sensory changes, lymphadenopathy or oral thrush.   Glucosuria = denies excessive thirst, excessive hunger. Off TAF regimen now. Understands that we are monitoring his kidney filtration due to possible s/e of one of the medications he was on previously.   Health Maintenance = Smoking a little bit (10 cigs a week).   Review of Systems  Constitutional: Negative for chills, fever, malaise/fatigue and weight loss.  HENT: Negative for sore throat.        False teeth.   Respiratory: Negative for cough and sputum production.   Cardiovascular: Negative for chest pain and leg swelling.  Gastrointestinal: Negative for abdominal pain, diarrhea and vomiting.    Genitourinary: Negative for dysuria and flank pain.  Musculoskeletal: Negative for joint pain, myalgias and neck pain.  Skin: Negative for rash.  Neurological: Negative for dizziness, tingling and headaches.  Psychiatric/Behavioral: Negative for depression. The patient is not nervous/anxious and does not have insomnia.     Past Medical History:  Diagnosis Date  . Human immunodeficiency virus (HIV) disease (HCC) 01/01/2008   Dx 2009  10/2016 - E92Q INSTI mutation (DTG sensitive) developed on Genvoya    . Hyperlipidemia 10/22/2014   Outpatient Medications Prior to Visit  Medication Sig Dispense Refill  . Multiple Vitamin (MULTIVITAMIN) tablet Take 1 tablet by mouth daily.    . darunavir-cobicistat (PREZCOBIX) 800-150 MG tablet Take 1 tablet by mouth daily with breakfast. Swallow whole. Do NOT crush, break or chew tablets. Take with food. 30 tablet 5  . Dolutegravir-Rilpivirine (JULUCA) 50-25 MG TABS Take 1 tablet by mouth daily. 30 tablet 5  . sulfamethoxazole-trimethoprim (BACTRIM,SEPTRA) 400-80 MG tablet Take 1 tablet by mouth daily. 30 tablet 2   No facility-administered medications prior to visit.    No Known Allergies  Objective:  Vitals:   03/05/17 0907  BP: 105/71  Pulse: 85  Temp: 98.3 F (36.8 C)  TempSrc: Oral  Weight: 144 lb 4 oz (65.4 kg)  Height: 5\' 11"  (1.803 m)   Body mass index is 20.12 kg/m.  Physical Exam  Constitutional: He is oriented to person, place, and time and well-developed, well-nourished, and in no distress.  Very pleasant and in good spirits today.   HENT:  Mouth/Throat: No oral lesions. Normal dentition. No dental caries.  Eyes: No scleral icterus.  Cardiovascular: Normal rate, regular  rhythm and normal heart sounds.  Pulmonary/Chest: Effort normal and breath sounds normal.  Abdominal: Soft. He exhibits no distension. There is no tenderness.  Lymphadenopathy:    He has no cervical adenopathy.  Neurological: He is alert and oriented to  person, place, and time.  Skin: Skin is warm and dry. No rash noted.  Psychiatric: Mood and affect normal.  Vitals reviewed.  Lab Results Lab Results  Component Value Date   WBC 5.6 09/29/2014   HGB 13.7 09/29/2014   HCT 43.0 09/29/2014   MCV 79.3 09/29/2014   PLT 235 09/29/2014    Lab Results  Component Value Date   CREATININE 1.09 02/19/2017   BUN 8 02/19/2017   NA 137 02/19/2017   K 4.3 02/19/2017   CL 105 02/19/2017   CO2 26 02/19/2017    Lab Results  Component Value Date   ALT 11 02/19/2017   AST 12 02/19/2017   ALKPHOS 75 09/29/2014   BILITOT 0.3 02/19/2017    Lab Results  Component Value Date   CHOL 145 10/17/2016   HDL 37 (L) 10/17/2016   LDLCALC 75 10/17/2016   TRIG 166 (H) 10/17/2016   CHOLHDL 3.9 10/17/2016   HIV 1 RNA Quant (copies/mL)  Date Value  02/19/2017 <20 NOT DETECTED  01/01/2017 <20 DETECTED (A)  09/29/2014 <20   HIV-1 RNA Viral Load (copies/mL)  Date Value  10/17/2016 29,400   CD4 T Cell Abs (/uL)  Date Value  02/19/2017 240 (L)  01/01/2017 180 (L)  10/19/2016 150 (L)   Lab Results  Component Value Date   HAV neg 11/15/2007   Lab Results  Component Value Date   HEPBSAG NON-REACTIVE 10/19/2016   HEPBSAB NON-REACTIVE 10/19/2016   Lab Results  Component Value Date   HCVAB Neg 11/15/2007   Lab Results  Component Value Date   CHLAMYDIAWP Negative 10/19/2016   N Negative 10/19/2016     Problem List Items Addressed This Visit      Other   CIGARETTE SMOKER (Chronic)    Down to 10 cigs a week. Counseled to quit. Also advised to take baby ASA once daily for CV risk reduction.      Glycosuria with normal serum glucose - Primary    Presumably r/t Fanconi's syndrome being on TAF. He is now on TAF sparing regimen. Check urinalysis today with spot creatinine and phos for continued monitoring. May need referral to nephrology if does not correct off TAF.       Relevant Orders   Urinalysis   Microalbumin / creatinine urine  ratio   Phosphorus, urine, random   Human immunodeficiency virus (HIV) disease (HCC) (Chronic)    He has been undetectable the last 2 times we have checked labs. Congratulated today. He would like more frequent follow up with me to ensure he "stays on track". Will have him return in 4 months with labs prior to for continued monitoring. Continue Juluca + Prezcobix. Would also like for him to continue Bactrim x 3 months for immune protection. Declined condoms today.  Refilled ART and Bactrim today.       Relevant Medications   sulfamethoxazole-trimethoprim (BACTRIM,SEPTRA) 400-80 MG tablet   Dolutegravir-Rilpivirine (JULUCA) 50-25 MG TABS   darunavir-cobicistat (PREZCOBIX) 800-150 MG tablet   Other Relevant Orders   HIV 1 RNA quant-no reflex-bld   T-helper cell (CD4)- ([facility] clinic only)    Other Visit Diagnoses    Medication monitoring encounter       Relevant Orders   CBC with Differential/Platelet  Meds ordered this encounter  Medications  . sulfamethoxazole-trimethoprim (BACTRIM,SEPTRA) 400-80 MG tablet    Sig: Take 1 tablet by mouth daily.    Dispense:  30 tablet    Refill:  3    Order Specific Question:   Supervising Provider    Answer:   HATCHER, JEFFREY C [[id number]]  . Dolutegravir-Rilpivirine (JULUCA) 50-25 MG TABS    Sig: Take 1 tablet by mouth daily.    Dispense:  30 tablet    Refill:  5    Do not fill until patient requests please. Increasing refill # until next visit. Thank you    Order Specific Question:   Supervising Provider    Answer:   HATCHER, JEFFREY C [[id number]]  . darunavir-cobicistat (PREZCOBIX) 800-150 MG tablet    Sig: Take 1 tablet by mouth daily with breakfast. Swallow whole. Do NOT crush, break or chew tablets. Take with food.    Dispense:  30 tablet    Refill:  5    Do not fill until patient requests please. Increasing refill # until next visit. Thank you    Order Specific Question:   Supervising Provider    Answer:   HATCHER, JEFFREY C [[id number]]     Rexene AlbertsStephanie Enma Maeda, MSN, NP-C [facility]   03/05/17 10:55 am

## 2017-03-05 NOTE — Assessment & Plan Note (Addendum)
He has been undetectable the last 2 times we have checked labs. Congratulated today. He would like more frequent follow up with me to ensure he "stays on track". Will have him return in 4 months with labs prior to for continued monitoring. Continue Juluca + Prezcobix. Would also like for him to continue Bactrim x 3 months for immune protection. Declined condoms today.  Refilled ART and Bactrim today.

## 2017-03-05 NOTE — Assessment & Plan Note (Signed)
Presumably r/t Fanconi's syndrome being on TAF. He is now on TAF sparing regimen. Check urinalysis today with spot creatinine and phos for continued monitoring. May need referral to nephrology if does not correct off TAF.

## 2017-03-05 NOTE — Patient Instructions (Addendum)
Continue taking your Juluca, Prezcobix and your Bactrim once a day.   Will have you back in 4 months with labs 2 weeks before.   Please sign up with MyChart to access your labs and set up email communication with our clinic for non-urgent medical concerns.   Happy Holidays!

## 2017-03-09 LAB — PHOSPHORUS, URINE, RANDOM: PHOSPHATE, RANDOM URINE: 18 mg/dL

## 2017-03-09 LAB — URINALYSIS
Bilirubin Urine: NEGATIVE
Hgb urine dipstick: NEGATIVE
KETONES UR: NEGATIVE
Leukocytes, UA: NEGATIVE
NITRITE: NEGATIVE
PROTEIN: NEGATIVE
SPECIFIC GRAVITY, URINE: 1.018 (ref 1.001–1.03)
pH: <=5 (ref 5.0–8.0)

## 2017-03-09 LAB — MICROALBUMIN / CREATININE URINE RATIO: Creatinine, Urine: 67 mg/dL (ref 20–320)

## 2017-06-21 ENCOUNTER — Other Ambulatory Visit: Payer: Self-pay | Admitting: *Deleted

## 2017-06-21 DIAGNOSIS — B20 Human immunodeficiency virus [HIV] disease: Secondary | ICD-10-CM

## 2017-07-02 ENCOUNTER — Other Ambulatory Visit: Payer: Medicaid Other

## 2017-07-02 DIAGNOSIS — Z5181 Encounter for therapeutic drug level monitoring: Secondary | ICD-10-CM

## 2017-07-02 DIAGNOSIS — B20 Human immunodeficiency virus [HIV] disease: Secondary | ICD-10-CM

## 2017-07-02 LAB — CBC WITH DIFFERENTIAL/PLATELET
Basophils Absolute: 32 cells/uL (ref 0–200)
Basophils Relative: 0.4 %
Eosinophils Absolute: 63 cells/uL (ref 15–500)
Eosinophils Relative: 0.8 %
HEMATOCRIT: 42.9 % (ref 38.5–50.0)
Hemoglobin: 14.1 g/dL (ref 13.2–17.1)
Lymphs Abs: 3057 cells/uL (ref 850–3900)
MCH: 25.2 pg — ABNORMAL LOW (ref 27.0–33.0)
MCHC: 32.9 g/dL (ref 32.0–36.0)
MCV: 76.7 fL — ABNORMAL LOW (ref 80.0–100.0)
MONOS PCT: 7.6 %
MPV: 10 fL (ref 7.5–12.5)
NEUTROS PCT: 52.5 %
Neutro Abs: 4148 cells/uL (ref 1500–7800)
PLATELETS: 311 10*3/uL (ref 140–400)
RBC: 5.59 10*6/uL (ref 4.20–5.80)
RDW: 13.9 % (ref 11.0–15.0)
TOTAL LYMPHOCYTE: 38.7 %
WBC mixed population: 600 cells/uL (ref 200–950)
WBC: 7.9 10*3/uL (ref 3.8–10.8)

## 2017-07-02 LAB — COMPLETE METABOLIC PANEL WITH GFR
AG RATIO: 1.1 (calc) (ref 1.0–2.5)
ALBUMIN MSPROF: 3.9 g/dL (ref 3.6–5.1)
ALT: 11 U/L (ref 9–46)
AST: 15 U/L (ref 10–35)
Alkaline phosphatase (APISO): 76 U/L (ref 40–115)
BUN: 12 mg/dL (ref 7–25)
CALCIUM: 9.4 mg/dL (ref 8.6–10.3)
CO2: 23 mmol/L (ref 20–32)
Chloride: 104 mmol/L (ref 98–110)
Creat: 0.95 mg/dL (ref 0.70–1.33)
GFR, EST AFRICAN AMERICAN: 106 mL/min/{1.73_m2} (ref >=60)
GFR, EST NON AFRICAN AMERICAN: 92 mL/min/{1.73_m2} (ref >=60)
GLOBULIN: 3.6 g/dL (ref 1.9–3.7)
Glucose, Bld: 77 mg/dL (ref 65–99)
POTASSIUM: 4.3 mmol/L (ref 3.5–5.3)
Sodium: 136 mmol/L (ref 135–146)
TOTAL PROTEIN: 7.5 g/dL (ref 6.1–8.1)
Total Bilirubin: 0.3 mg/dL (ref 0.2–1.2)

## 2017-07-03 LAB — T-HELPER CELL (CD4) - (RCID CLINIC ONLY)
CD4 % Helper T Cell: 6 % — ABNORMAL LOW (ref 33–55)
CD4 T CELL ABS: 220 /uL — AB (ref 400–2700)

## 2017-07-04 LAB — HIV-1 RNA QUANT-NO REFLEX-BLD
HIV 1 RNA QUANT: NOT DETECTED {copies}/mL
HIV-1 RNA QUANT, LOG: NOT DETECTED {Log_copies}/mL

## 2017-07-16 ENCOUNTER — Ambulatory Visit (INDEPENDENT_AMBULATORY_CARE_PROVIDER_SITE_OTHER): Payer: Medicaid Other | Admitting: Infectious Diseases

## 2017-07-16 ENCOUNTER — Encounter: Payer: Self-pay | Admitting: Infectious Diseases

## 2017-07-16 VITALS — BP 110/73 | HR 71 | Temp 97.3°F | Wt 142.0 lb

## 2017-07-16 DIAGNOSIS — R81 Glycosuria: Secondary | ICD-10-CM | POA: Diagnosis not present

## 2017-07-16 DIAGNOSIS — B20 Human immunodeficiency virus [HIV] disease: Secondary | ICD-10-CM | POA: Diagnosis not present

## 2017-07-16 DIAGNOSIS — F17200 Nicotine dependence, unspecified, uncomplicated: Secondary | ICD-10-CM | POA: Diagnosis not present

## 2017-07-16 NOTE — Progress Notes (Signed)
William [name] Jun 15, 1965 [id number] PCP: John Giovanni, MD   CC; HIV routine follow up care  Brief Narrative:  William [name] is a 52 y.o. AA male with HIV infection. Originally Dx in 2009. Previous regimens: Genvoya. HIV Risk: heterosexual contact. OI Hx: none from his report  Previous Regimen:  Genvoya --> poor compliance, developed resistance   Genotype:   10/2016 - M184V, E98Q (R-elvitegravir; S-dolutegravir)   Patient Active Problem List   Diagnosis Date Noted  . 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 Low body weight due to inadequate caloric intake 02/11/2015  . Preventative health care 10/22/2014  . Dyslipidemia 10/22/2014  . CIGARETTE SMOKER 06/02/2008  . Human immunodeficiency virus (HIV) disease (HCC) 01/01/2008    HPI:  William [name] is here today for routine HIV follow up care. He reports to bee feeling very well since our last visit and no changes to medical history since. No interval periods of illness either. He continues on his Juluca, Prezcobix and prophylactic Bactrim once daily and reports no missed doses. Previously quit for about 3 years. Reports no complaints today suggestive of associated opportunistic infection or advancing HIV disease such as fevers, night sweats, weight loss, anorexia, cough, SOB, nausea, vomiting, diarrhea, headache, sensory changes, lymphadenopathy or oral thrush.   Continues to smoke about 1/2 a pack per week now. Would like to quit sometime in the future. Reported to have quit cold [place] for 3 years before and picked it up again for some reason. Working with primary care provider to schedule colonoscopy and other recommended screenings for age.   Review of Systems  Constitutional: Negative for chills, fever, malaise/fatigue and weight loss.  HENT: Negative for sore throat.        False teeth.   Respiratory: Negative for cough and sputum production.   Cardiovascular: Negative for chest pain and leg swelling.    Gastrointestinal: Negative for abdominal pain, diarrhea and vomiting.  Genitourinary: Negative for dysuria and flank pain.  Musculoskeletal: Negative for joint pain, myalgias and neck pain.  Skin: Negative for rash.  Neurological: Negative for dizziness, tingling and headaches.  Psychiatric/Behavioral: Negative for depression. The patient is not nervous/anxious and does not have insomnia.     Past Medical History:  Diagnosis Date  . Human immunodeficiency virus (HIV) disease (HCC) 01/01/2008   Dx 2009  10/2016 - E92Q INSTI mutation (DTG sensitive) developed on Genvoya    . Hyperlipidemia 10/22/2014   Outpatient Medications Prior to Visit  Medication Sig Dispense Refill  . darunavir-cobicistat (PREZCOBIX) 800-150 MG tablet Take 1 tablet by mouth daily with breakfast. Swallow whole. Do NOT crush, break or chew tablets. Take with food. 30 tablet 5  . Dolutegravir-Rilpivirine (JULUCA) 50-25 MG TABS Take 1 tablet by mouth daily. 30 tablet 5  . Multiple Vitamin (MULTIVITAMIN) tablet Take 1 tablet by mouth daily.    Marland Kitchen sulfamethoxazole-trimethoprim (BACTRIM,SEPTRA) 400-80 MG tablet Take 1 tablet by mouth daily. 30 tablet 3   No facility-administered medications prior to visit.    No Known Allergies  Objective:  Vitals:   07/16/17 0852  BP: 110/73  Pulse: 71  Temp: (!) 97.3 F (36.3 C)  TempSrc: Oral  Weight: 142 lb (64.4 kg)   Body mass index is 19.8 kg/m.  Physical Exam  Constitutional: He is oriented to person, place, and time and well-developed, well-nourished, and in no distress.  Very pleasant. Seated comfortably in chair.   HENT:  Mouth/Throat: No oral lesions. Normal dentition. No dental caries.  Eyes: No scleral  icterus.  Cardiovascular: Normal rate, regular rhythm and normal heart sounds.  Pulmonary/Chest: Effort normal and breath sounds normal.  Abdominal: Soft. He exhibits no distension. There is no tenderness.  Lymphadenopathy:    He has no cervical adenopathy.   Neurological: He is alert and oriented to person, place, and time.  Skin: Skin is warm and dry. No rash noted.  Psychiatric: Mood and affect normal.  Vitals reviewed.  Lab Results Lab Results  Component Value Date   WBC 7.9 07/02/2017   HGB 14.1 07/02/2017   HCT 42.9 07/02/2017   MCV 76.7 (L) 07/02/2017   PLT 311 07/02/2017    Lab Results  Component Value Date   CREATININE 0.95 07/02/2017   BUN 12 07/02/2017   NA 136 07/02/2017   K 4.3 07/02/2017   CL 104 07/02/2017   CO2 23 07/02/2017    Lab Results  Component Value Date   ALT 11 07/02/2017   AST 15 07/02/2017   ALKPHOS 75 09/29/2014   BILITOT 0.3 07/02/2017    Lab Results  Component Value Date   CHOL 145 10/17/2016   HDL 37 (L) 10/17/2016   LDLCALC 75 10/17/2016   TRIG 166 (H) 10/17/2016   CHOLHDL 3.9 10/17/2016   HIV 1 RNA Quant (copies/mL)  Date Value  07/02/2017 <20 NOT DETECTED  02/19/2017 <20 NOT DETECTED  01/01/2017 <20 DETECTED (A)   HIV-1 RNA Viral Load (copies/mL)  Date Value  10/17/2016 29,400   CD4 T Cell Abs (/uL)  Date Value  07/02/2017 220 (L)  02/19/2017 240 (L)  01/01/2017 180 (L)   Lab Results  Component Value Date   HAV neg 11/15/2007   Lab Results  Component Value Date   HEPBSAG NON-REACTIVE 10/19/2016   HEPBSAB NON-REACTIVE 10/19/2016   Lab Results  Component Value Date   HCVAB Neg 11/15/2007   Lab Results  Component Value Date   CHLAMYDIAWP Negative 10/19/2016   N Negative 10/19/2016     Problem List Items Addressed This Visit      Other   Human immunodeficiency virus (HIV) disease (HCC) - Primary (Chronic)    He has demonstrated 6 months of viral suppression on new regimen. He is doing much better and highly motivated to continue this practice. Counseled on U=U concept. He is comfortable coming back in 6 months with annual labs at that time.       Relevant Orders   HIV 1 RNA quant-no reflex-bld   T-helper cell (CD4)- ([facility] clinic only)   COMPLETE METABOLIC  PANEL WITH GFR   CBC with Differential/Platelet   RPR   Lipid panel   Urine cytology ancillary only   Urinalysis   CIGARETTE SMOKER (Chronic)    Counseled to quit today. Discussed resources.       Glycosuria with normal serum glucose    Recheck urinalysis in 6 months. If he continues to have glycosuria off tenofovir regimen would recommend evaluation with nephrology.         No orders of the defined types were placed in this encounter.  [name], MSN, NP-C [facility]

## 2017-07-16 NOTE — Assessment & Plan Note (Signed)
He has demonstrated 6 months of viral suppression on new regimen. He is doing much better and highly motivated to continue this practice. Counseled on U=U concept. He is comfortable coming back in 6 months with annual labs at that time.

## 2017-07-16 NOTE — Assessment & Plan Note (Signed)
Counseled to quit today. Discussed resources.

## 2017-07-16 NOTE — Patient Instructions (Addendum)
Can stop your Bactrim antibiotic.   Continue your Juluca and Prezcobix every day. Your viral load is undetectable again. Great work!   Please come back in 6 months to see William [name] with labs prior to.

## 2017-07-16 NOTE — Assessment & Plan Note (Signed)
Recheck urinalysis in 6 months. If he continues to have glycosuria off tenofovir regimen would recommend evaluation with nephrology.

## 2017-07-17 ENCOUNTER — Encounter: Payer: Self-pay | Admitting: *Deleted

## 2017-07-23 DIAGNOSIS — H40033 Anatomical narrow angle, bilateral: Secondary | ICD-10-CM | POA: Diagnosis not present

## 2017-07-23 DIAGNOSIS — H2513 Age-related nuclear cataract, bilateral: Secondary | ICD-10-CM | POA: Diagnosis not present

## 2017-07-24 ENCOUNTER — Encounter: Payer: Medicaid Other | Admitting: Internal Medicine

## 2017-08-07 ENCOUNTER — Other Ambulatory Visit: Payer: Self-pay

## 2017-08-07 ENCOUNTER — Encounter: Payer: Self-pay | Admitting: Internal Medicine

## 2017-08-07 ENCOUNTER — Ambulatory Visit: Payer: Medicaid Other | Admitting: Internal Medicine

## 2017-08-07 VITALS — BP 105/77 | HR 75 | Temp 98.0°F | Wt 138.5 lb

## 2017-08-07 DIAGNOSIS — B20 Human immunodeficiency virus [HIV] disease: Secondary | ICD-10-CM | POA: Diagnosis not present

## 2017-08-07 DIAGNOSIS — R636 Underweight: Secondary | ICD-10-CM | POA: Diagnosis not present

## 2017-08-07 DIAGNOSIS — Z0000 Encounter for general adult medical examination without abnormal findings: Secondary | ICD-10-CM

## 2017-08-07 DIAGNOSIS — Z79899 Other long term (current) drug therapy: Secondary | ICD-10-CM

## 2017-08-07 DIAGNOSIS — Z681 Body mass index (BMI) 19 or less, adult: Secondary | ICD-10-CM

## 2017-08-07 DIAGNOSIS — F17210 Nicotine dependence, cigarettes, uncomplicated: Secondary | ICD-10-CM

## 2017-08-07 DIAGNOSIS — F17200 Nicotine dependence, unspecified, uncomplicated: Secondary | ICD-10-CM

## 2017-08-08 ENCOUNTER — Other Ambulatory Visit: Payer: Self-pay | Admitting: Infectious Diseases

## 2017-08-08 DIAGNOSIS — B20 Human immunodeficiency virus [HIV] disease: Secondary | ICD-10-CM

## 2017-08-08 NOTE — Progress Notes (Signed)
   CC: Follow-up of tobacco use and healthcare maintenance.  HPI:  WilliamWilliam [name] is a 52 y.o. male who with a past medical history of HIV resenting to the clinic for a follow-up of tobacco use and healthcare maintenance. Please see problem based charting for the status of the patient's current and chronic medical conditions.   Past Medical History:  Diagnosis Date  . Human immunodeficiency virus (HIV) disease (HCC) 01/01/2008   Dx 2009  10/2016 - E92Q INSTI mutation (DTG sensitive) developed on Genvoya    . Hyperlipidemia 10/22/2014   Review of Systems:  Review of Systems  Constitutional: Negative for malaise/fatigue and weight loss.  Respiratory: Negative for shortness of breath.   Cardiovascular: Negative for chest pain and leg swelling.  Gastrointestinal: Negative for abdominal pain.    Physical Exam:  Vitals:   08/07/17 1534  BP: 105/77  Pulse: 75  Temp: 98 F (36.7 C)  TempSrc: Oral  SpO2: 97%  Weight: 138 lb 8 oz (62.8 kg)   Physical Exam  Constitutional: He is oriented to person, place, and time. No distress.  HENT:  Head: Normocephalic and atraumatic.  Mouth/Throat: Oropharynx is clear and moist.  Cardiovascular: Normal rate, regular rhythm and intact distal pulses.  Pulmonary/Chest: Effort normal and breath sounds normal. No respiratory distress.  Abdominal: Soft. Bowel sounds are normal. He exhibits no distension. There is no tenderness.  Musculoskeletal: He exhibits no edema.  Neurological: He is alert and oriented to person, place, and time.  Skin: Skin is warm and dry.    Assessment & Plan:   See Encounters Tab for problem based charting.  Patient discussed with Dr. Cleda Daub

## 2017-08-08 NOTE — Assessment & Plan Note (Signed)
Risks of cigarette smoking were discussed during this visit.  Patient states he continues to smoke 1/2 pack/day and is not interested in quitting.  Plan -Discuss again at future visit

## 2017-08-08 NOTE — Assessment & Plan Note (Signed)
Weight stable.  He continues to drink Ensure supplement 3 times a day.  His appetite is good.  Plan -Continue to monitor

## 2017-08-08 NOTE — Assessment & Plan Note (Addendum)
Labs from July 02, 2017 showing undetectable viral RNA load and CD4 count 220.  He is being followed by infectious disease and most recent office visit was on July 16, 2017.  Currently taking Juluca and Prezcobix.  Plan -Encouraged him to continue going to his infectious disease appointments

## 2017-08-08 NOTE — Assessment & Plan Note (Addendum)
Patient is refusing both colonoscopy and iFOBT testing.  States he will call the clinic if he changes his decision.

## 2017-08-10 NOTE — Progress Notes (Signed)
Internal Medicine Clinic Attending  Case discussed with Dr. Rathoreat the time of the visit. We reviewed the resident's history and exam and pertinent patient test results. I agree with the assessment, diagnosis, and plan of care documented in the resident's note.  

## 2017-09-12 ENCOUNTER — Encounter: Payer: Self-pay | Admitting: *Deleted

## 2017-11-07 ENCOUNTER — Other Ambulatory Visit: Payer: Self-pay | Admitting: Behavioral Health

## 2017-11-07 DIAGNOSIS — B20 Human immunodeficiency virus [HIV] disease: Secondary | ICD-10-CM

## 2017-11-07 MED ORDER — DOLUTEGRAVIR-RILPIVIRINE 50-25 MG PO TABS: 1.0000 | ORAL_TABLET | Freq: Every day | ORAL | 2 refills | Status: DC

## 2017-11-07 MED ORDER — DARUNAVIR-COBICISTAT 800-150 MG PO TABS: 1.0000 | ORAL_TABLET | Freq: Every day | ORAL | 2 refills | Status: DC

## 2018-01-14 ENCOUNTER — Other Ambulatory Visit (HOSPITAL_COMMUNITY)
Admission: RE | Admit: 2018-01-14 | Discharge: 2018-01-14 | Disposition: A | Discharge status: Home / Self Care | Payer: Medicaid Other | Source: Ambulatory Visit | Attending: Infectious Diseases | Admitting: Infectious Diseases

## 2018-01-14 ENCOUNTER — Other Ambulatory Visit: Payer: Medicaid Other

## 2018-01-14 DIAGNOSIS — B20 Human immunodeficiency virus [HIV] disease: Secondary | ICD-10-CM | POA: Insufficient documentation

## 2018-01-14 LAB — URINALYSIS
BILIRUBIN URINE: NEGATIVE
Hgb urine dipstick: NEGATIVE
Ketones, ur: NEGATIVE
Leukocytes, UA: NEGATIVE
Nitrite: NEGATIVE
Protein, ur: NEGATIVE
Specific Gravity, Urine: 1.023 (ref 1.001–1.03)
pH: <=5 (ref 5.0–8.0)

## 2018-01-15 LAB — T-HELPER CELL (CD4) - (RCID CLINIC ONLY)
CD4 T CELL ABS: 240 /uL — AB (ref 400–2700)
CD4 T CELL HELPER: 7 % — AB (ref 33–55)

## 2018-01-15 LAB — URINE CYTOLOGY ANCILLARY ONLY
Chlamydia: NEGATIVE
Neisseria Gonorrhea: NEGATIVE

## 2018-01-16 LAB — COMPLETE METABOLIC PANEL WITH GFR
AG Ratio: 1.4 (calc) (ref 1.0–2.5)
ALBUMIN MSPROF: 4.2 g/dL (ref 3.6–5.1)
ALT: 9 U/L (ref 9–46)
AST: 16 U/L (ref 10–35)
Alkaline phosphatase (APISO): 74 U/L (ref 40–115)
BILIRUBIN TOTAL: 0.4 mg/dL (ref 0.2–1.2)
BUN: 14 mg/dL (ref 7–25)
CALCIUM: 9.5 mg/dL (ref 8.6–10.3)
CHLORIDE: 105 mmol/L (ref 98–110)
CO2: 24 mmol/L (ref 20–32)
Creat: 1.06 mg/dL (ref 0.70–1.33)
GFR, EST AFRICAN AMERICAN: 93 mL/min/{1.73_m2} (ref >=60)
GFR, Est Non African American: 80 mL/min/{1.73_m2} (ref >=60)
GLOBULIN: 3 g/dL (ref 1.9–3.7)
Glucose, Bld: 64 mg/dL — ABNORMAL LOW (ref 65–99)
Potassium: 4.4 mmol/L (ref 3.5–5.3)
Sodium: 137 mmol/L (ref 135–146)
Total Protein: 7.2 g/dL (ref 6.1–8.1)

## 2018-01-16 LAB — CBC WITH DIFFERENTIAL/PLATELET
Basophils Absolute: 27 cells/uL (ref 0–200)
Basophils Relative: 0.4 %
EOS PCT: 0.9 %
Eosinophils Absolute: 61 cells/uL (ref 15–500)
HCT: 46.8 % (ref 38.5–50.0)
Hemoglobin: 15.1 g/dL (ref 13.2–17.1)
LYMPHS ABS: 3210 {cells}/uL (ref 850–3900)
MCH: 25.4 pg — ABNORMAL LOW (ref 27.0–33.0)
MCHC: 32.3 g/dL (ref 32.0–36.0)
MCV: 78.8 fL — ABNORMAL LOW (ref 80.0–100.0)
MONOS PCT: 8.3 %
MPV: 10.3 fL (ref 7.5–12.5)
Neutro Abs: 2938 cells/uL (ref 1500–7800)
Neutrophils Relative %: 43.2 %
PLATELETS: 253 10*3/uL (ref 140–400)
RBC: 5.94 10*6/uL — ABNORMAL HIGH (ref 4.20–5.80)
RDW: 14.1 % (ref 11.0–15.0)
TOTAL LYMPHOCYTE: 47.2 %
WBC mixed population: 564 cells/uL (ref 200–950)
WBC: 6.8 10*3/uL (ref 3.8–10.8)

## 2018-01-16 LAB — HIV-1 RNA QUANT-NO REFLEX-BLD
HIV 1 RNA QUANT: 115 {copies}/mL — AB
HIV-1 RNA QUANT, LOG: 2.06 {Log_copies}/mL — AB

## 2018-01-16 LAB — LIPID PANEL
CHOL/HDL RATIO: 3.9 (calc) (ref <5.0)
Cholesterol: 204 mg/dL — ABNORMAL HIGH (ref <200)
HDL: 52 mg/dL (ref >40)
LDL CHOLESTEROL (CALC): 124 mg/dL — AB
NON-HDL CHOLESTEROL (CALC): 152 mg/dL — AB (ref <130)
TRIGLYCERIDES: 162 mg/dL — AB (ref <150)

## 2018-01-16 LAB — RPR: RPR: NONREACTIVE

## 2018-01-28 ENCOUNTER — Encounter: Payer: Self-pay | Admitting: Infectious Diseases

## 2018-01-28 ENCOUNTER — Ambulatory Visit: Payer: Medicaid Other | Admitting: Infectious Diseases

## 2018-01-28 VITALS — BP 109/77 | HR 82 | Temp 98.1°F | Ht 71.0 in | Wt 148.0 lb

## 2018-01-28 DIAGNOSIS — F17200 Nicotine dependence, unspecified, uncomplicated: Secondary | ICD-10-CM | POA: Diagnosis not present

## 2018-01-28 DIAGNOSIS — R81 Glycosuria: Secondary | ICD-10-CM | POA: Diagnosis not present

## 2018-01-28 DIAGNOSIS — B20 Human immunodeficiency virus [HIV] disease: Secondary | ICD-10-CM | POA: Diagnosis not present

## 2018-01-28 DIAGNOSIS — Z23 Encounter for immunization: Secondary | ICD-10-CM

## 2018-01-28 DIAGNOSIS — Z0000 Encounter for general adult medical examination without abnormal findings: Secondary | ICD-10-CM

## 2018-01-28 MED ORDER — DARUNAVIR-COBICISTAT 800-150 MG PO TABS: 1.0000 | ORAL_TABLET | Freq: Every day | ORAL | 5 refills | Status: DC

## 2018-01-28 MED ORDER — DOLUTEGRAVIR-RILPIVIRINE 50-25 MG PO TABS: 1.0000 | ORAL_TABLET | Freq: Every day | ORAL | 5 refills | Status: DC

## 2018-01-28 NOTE — Assessment & Plan Note (Signed)
Encouraged him to reduce further to 5 cigarettes a day. Discussed importance with [facility] to reduce all modifiable risk factors to prevent CV events. He will work on this.

## 2018-01-28 NOTE — Assessment & Plan Note (Signed)
William [name] has done much better committing to taking his HIV medications daily. He has low level viremia with dna detected at 115 copies - I think this is mostly likely due to not taking his medications with a full meal which is required for adequate absorption of his medications. He is also taking with other MVIs. He will over the next 4-6 weeks work on taking with full meal (will start taking it every evening with food since this is more consistent for him) and separating his mvi by at least 6 hours to ensure maximal absorption.   He has high level resistance to elvitegrivir and raltegravir with low level resistance to dolutegrivir and bictegrivir with E92Q mutation >> the cobicistat in the prezcobix should augment the dolutegrivir enough but would need to consider putting him on BID dosing. Will see what his VL does in 4-6 weeks.

## 2018-01-28 NOTE — Patient Instructions (Addendum)
Continue taking your Prezcobix + Juluca with a full meal --> both of these medications require at least 400 calories.   Separate your multivitamin by the Juluca by 4 hours.   Will send a referral to kidney team to look at why you have so much sugar in your urine.    Please come back in 6 months for office visit with labs prior to.   Please come back for labs again in 4 weeks to recheck your viral load.    Try to cut down to 5 cigarettes a day by your next appointment :)

## 2018-01-28 NOTE — Assessment & Plan Note (Signed)
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 Will refer to nephrology to evaluate further.

## 2018-01-28 NOTE — Progress Notes (Signed)
Patient Name: William [name]  Date of Birth: 1966-01-09  MRN: [id number]  PCP: Dionne Ano, MD    Patient Active Problem List   Diagnosis Date Noted  . 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 Low body weight due to inadequate caloric intake 02/11/2015  . Preventative health care 10/22/2014  . Dyslipidemia 10/22/2014  . CIGARETTE SMOKER 06/02/2008  . Human immunodeficiency virus (HIV) disease (HCC) 01/01/2008    SUBJECTIVE: Brief Narrative:  William [name] is a 52 y.o. AA male with HIV infection. Originally Dx in 2009. Previous regimens: Genvoya. HIV Risk: heterosexual contact. 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    HPI/ROS: William [name] is a 52 y.o. man with HIV disease here for routine follow up care.   He has nothing new to report and has been in good health since our last visit; no changes to medical history since. He continues on his Juluca, Prezcobix once a day. Has not missed any doses. Takes his medications with multivitamin every day and in the morning. Does not eat breakfast consistently with his meds. Otherwise reports no complaints today suggestive of associated opportunistic infection or advancing HIV disease such as fevers, night sweats, weight loss, anorexia, cough, SOB, nausea, vomiting, diarrhea, headache, sensory changes, lymphadenopathy or oral thrush.    Continues to smoke about 1/2 a pack per week now. "Getting there" with regards to quitting. Has not had flu shot this year. Working with PCP about getting colonoscopy done.   Review of Systems  Constitutional: Negative for chills, fever, malaise/fatigue and weight loss.  HENT: Negative for sore throat.        False teeth.   Respiratory: Negative for cough and sputum production.   Cardiovascular: Negative for chest pain and leg swelling.  Gastrointestinal: Negative for abdominal pain, diarrhea and vomiting.  Genitourinary: Negative for dysuria and flank pain.  Musculoskeletal: Negative for joint pain, myalgias and neck pain.  Skin: Negative for rash.  Neurological: Negative for dizziness, tingling and headaches.  Psychiatric/Behavioral: Negative for depression. The patient is not nervous/anxious and does not have insomnia.     Past Medical History:  Diagnosis Date  . Human immunodeficiency virus (HIV) disease (HCC) 01/01/2008   Dx 2009  10/2016 - E92Q INSTI mutation (DTG sensitive) developed on Genvoya    . Hyperlipidemia 10/22/2014   Outpatient Medications Prior to Visit  Medication Sig Dispense Refill  . Multiple Vitamin (MULTIVITAMIN) tablet Take 1 tablet by mouth daily.    . darunavir-cobicistat (PREZCOBIX) 800-150 MG tablet Take 1 tablet by mouth daily with breakfast. Swallow whole. Do NOT crush, break or chew tablets. Take with food. 30 tablet 2  . Dolutegravir-Rilpivirine (JULUCA) 50-25 MG TABS Take 1 tablet by mouth daily. 30 tablet 2   No facility-administered medications prior to visit.    No Known Allergies  Objective:  Vitals:   01/28/18 0845  BP: 109/77  Pulse: 82  Temp: 98.1 F (36.7 C)  TempSrc: Oral  Weight: 148 lb (67.1 kg)  Height: 5\' 11"  (1.803 m)   Body mass index is 20.64 kg/m.  Physical Exam  Constitutional: He is oriented to person, place, and time and well-developed, well-nourished, and in no distress.  Very pleasant. Seated comfortably in chair.   HENT:  Mouth/Throat: No oral lesions. Normal dentition. No dental caries.  Eyes: No scleral icterus.  Cardiovascular: Normal rate, regular rhythm and normal heart sounds.  Pulmonary/Chest: Effort normal and breath sounds normal.  Abdominal: Soft. He exhibits no distension. There is no tenderness.    Lymphadenopathy:    He has no cervical adenopathy.  Neurological: He is alert and oriented to person, place, and time.  Skin: Skin is warm and dry. No rash noted.  Psychiatric: Mood and affect normal.  Vitals reviewed.  Lab Results Lab Results  Component Value Date   WBC 6.8 01/14/2018   HGB 15.1 01/14/2018   HCT 46.8 01/14/2018   MCV 78.8 (L) 01/14/2018   PLT 253 01/14/2018    Lab Results  Component Value Date   CREATININE 1.06 01/14/2018   BUN 14 01/14/2018   NA 137 01/14/2018   K 4.4 01/14/2018   CL 105 01/14/2018   CO2 24 01/14/2018    Lab Results  Component Value Date   ALT 9 01/14/2018   AST 16 01/14/2018   ALKPHOS 75 09/29/2014   BILITOT 0.4 01/14/2018    Lab Results  Component Value Date   CHOL 204 (H) 01/14/2018   HDL 52 01/14/2018   LDLCALC 124 (H) 01/14/2018   TRIG 162 (H) 01/14/2018   CHOLHDL 3.9 01/14/2018   HIV 1 RNA Quant (copies/mL)  Date Value  01/14/2018 115 (H)  07/02/2017 <20 NOT DETECTED  02/19/2017 <20 NOT DETECTED   HIV-1 RNA Viral Load (copies/mL)  Date Value  10/17/2016 29,400   CD4 T Cell Abs (/uL)  Date Value  01/14/2018 240 (L)  07/02/2017 220 (L)  02/19/2017 240 (L)   Lab Results  Component Value Date   HAV neg 11/15/2007   Lab Results  Component Value Date   HEPBSAG NON-REACTIVE 10/19/2016   HEPBSAB NON-REACTIVE 10/19/2016   Lab Results  Component Value Date   HCVAB Neg 11/15/2007   Lab Results  Component Value Date   CHLAMYDIAWP Negative 01/14/2018   N Negative 01/14/2018     Problem List Items Addressed This Visit      Unprioritized   CIGARETTE SMOKER (Chronic)    Encouraged him to reduce further to 5 cigarettes a day. Discussed importance with [facility] to reduce all modifiable risk factors to prevent CV events. He will work on this.       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 Will refer to nephrology to evaluate further.       Relevant Orders   Ambulatory referral to Nephrology   Human immunodeficiency virus (HIV) disease (HCC) - Primary (Chronic)    Brandonlee has done much better committing to taking his HIV medications daily. He has low level viremia with dna detected at 115 copies - I think this is mostly likely due to not taking his medications with a full meal which is required for adequate absorption of his medications. He is also taking with other MVIs. He will over the next 4-6 weeks work on taking with full meal (will start taking it every evening with food since this is more consistent for him) and separating his mvi by at least 6 hours to ensure maximal absorption.   He has high level resistance to elvitegrivir and raltegravir with low level resistance to dolutegrivir and bictegrivir with E92Q mutation >> the cobicistat in the prezcobix  should augment the dolutegrivir enough but would need to consider putting him on BID dosing. Will see what his VL does in 4-6 weeks.       Relevant Medications   Dolutegravir-Rilpivirine (JULUCA) 50-25 MG TABS   darunavir-cobicistat (PREZCOBIX) 800-150 MG tablet   Other Relevant Orders   HIV-1 RNA quant-no reflex-bld   HIV-1 RNA quant-no reflex-bld   T-helper cell (CD4)- ([facility] clinic only)   Preventative health care    Flu shot today. Otherwise up to date until 65th birthday for pneumovax booster.        Other Visit Diagnoses    Need for immunization against influenza       Relevant Orders   Flu Vaccine QUAD 36+ mos IM (Completed)     Return in about 6 months (around 07/29/2018) for labs, follow up.   [name], MSN, NP-C [facility]

## 2018-01-28 NOTE — Assessment & Plan Note (Signed)
Flu shot today. Otherwise up to date until 65th birthday for pneumovax booster.

## 2018-02-26 ENCOUNTER — Other Ambulatory Visit: Payer: Medicaid Other

## 2018-02-26 DIAGNOSIS — B20 Human immunodeficiency virus [HIV] disease: Secondary | ICD-10-CM

## 2018-02-28 LAB — T-HELPER CELL (CD4) - (RCID CLINIC ONLY)
CD4 % Helper T Cell: 7 % — ABNORMAL LOW (ref 33–55)
CD4 T Cell Abs: 260 /uL — ABNORMAL LOW (ref 400–2700)

## 2018-02-28 LAB — HIV-1 RNA QUANT-NO REFLEX-BLD
HIV 1 RNA Quant: <20 copies/mL
HIV-1 RNA Quant, Log: <1.3 Log copies/mL

## 2018-06-17 DIAGNOSIS — B20 Human immunodeficiency virus [HIV] disease: Secondary | ICD-10-CM | POA: Diagnosis not present

## 2018-06-17 DIAGNOSIS — Z720 Tobacco use: Secondary | ICD-10-CM | POA: Diagnosis not present

## 2018-06-17 DIAGNOSIS — R81 Glycosuria: Secondary | ICD-10-CM | POA: Diagnosis not present

## 2018-07-08 ENCOUNTER — Telehealth: Payer: Self-pay | Admitting: Infectious Diseases

## 2018-07-08 NOTE — Telephone Encounter (Signed)
COVID-19 Pre-Screening Questions: ° °Do you currently have a fever (>100 °F), chills or unexplained body aches? no  ° °Are you currently experiencing new cough, shortness of breath, sore throat, runny nose?no  °•  °Have you recently travelled outside the state of [place] in the last 14 days? no °•  °Have you been in contact with someone that is currently pending confirmation of Covid19 testing or has been confirmed to have the Covid19 virus? no °

## 2018-07-09 ENCOUNTER — Other Ambulatory Visit: Payer: Self-pay

## 2018-07-09 ENCOUNTER — Other Ambulatory Visit: Payer: Medicaid Other

## 2018-07-09 ENCOUNTER — Other Ambulatory Visit: Payer: Self-pay | Admitting: Behavioral Health

## 2018-07-09 DIAGNOSIS — B20 Human immunodeficiency virus [HIV] disease: Secondary | ICD-10-CM

## 2018-07-10 LAB — T-HELPER CELL (CD4) - (RCID CLINIC ONLY)
CD4 % Helper T Cell: 7 % — ABNORMAL LOW (ref 33–55)
CD4 T Cell Abs: 220 /uL — ABNORMAL LOW (ref 400–2700)

## 2018-07-11 LAB — COMPREHENSIVE METABOLIC PANEL
AG Ratio: 1.3 (calc) (ref 1.0–2.5)
ALT: 9 U/L (ref 9–46)
AST: 16 U/L (ref 10–35)
Albumin: 3.9 g/dL (ref 3.6–5.1)
Alkaline phosphatase (APISO): 72 U/L (ref 35–144)
BUN: 11 mg/dL (ref 7–25)
CO2: 25 mmol/L (ref 20–32)
Calcium: 8.9 mg/dL (ref 8.6–10.3)
Chloride: 106 mmol/L (ref 98–110)
Creat: 1.29 mg/dL (ref 0.70–1.33)
Globulin: 3 g/dL (calc) (ref 1.9–3.7)
Glucose, Bld: 79 mg/dL (ref 65–99)
Potassium: 4 mmol/L (ref 3.5–5.3)
Sodium: 137 mmol/L (ref 135–146)
Total Bilirubin: 0.3 mg/dL (ref 0.2–1.2)
Total Protein: 6.9 g/dL (ref 6.1–8.1)

## 2018-07-11 LAB — CBC WITH DIFFERENTIAL/PLATELET
Absolute Monocytes: 578 cells/uL (ref 200–950)
Basophils Absolute: 27 cells/uL (ref 0–200)
Basophils Relative: 0.4 %
Eosinophils Absolute: 88 cells/uL (ref 15–500)
Eosinophils Relative: 1.3 %
HCT: 45.1 % (ref 38.5–50.0)
Hemoglobin: 14.9 g/dL (ref 13.2–17.1)
Lymphs Abs: 2883 cells/uL (ref 850–3900)
MCH: 25.8 pg — ABNORMAL LOW (ref 27.0–33.0)
MCHC: 33 g/dL (ref 32.0–36.0)
MCV: 78.2 fL — ABNORMAL LOW (ref 80.0–100.0)
MPV: 10.3 fL (ref 7.5–12.5)
Monocytes Relative: 8.5 %
Neutro Abs: 3223 cells/uL (ref 1500–7800)
Neutrophils Relative %: 47.4 %
Platelets: 249 10*3/uL (ref 140–400)
RBC: 5.77 10*6/uL (ref 4.20–5.80)
RDW: 14 % (ref 11.0–15.0)
Total Lymphocyte: 42.4 %
WBC: 6.8 10*3/uL (ref 3.8–10.8)

## 2018-07-11 LAB — HIV-1 RNA QUANT-NO REFLEX-BLD
HIV 1 RNA Quant: <20 copies/mL
HIV-1 RNA Quant, Log: <1.3 Log copies/mL

## 2018-07-23 ENCOUNTER — Ambulatory Visit (INDEPENDENT_AMBULATORY_CARE_PROVIDER_SITE_OTHER): Payer: Medicaid Other | Admitting: Infectious Diseases

## 2018-07-23 ENCOUNTER — Encounter: Payer: Self-pay | Admitting: Infectious Diseases

## 2018-07-23 ENCOUNTER — Other Ambulatory Visit: Payer: Self-pay

## 2018-07-23 VITALS — BP 124/80 | HR 64 | Temp 97.6°F | Wt 147.0 lb

## 2018-07-23 DIAGNOSIS — Z0000 Encounter for general adult medical examination without abnormal findings: Secondary | ICD-10-CM | POA: Diagnosis not present

## 2018-07-23 DIAGNOSIS — B20 Human immunodeficiency virus [HIV] disease: Secondary | ICD-10-CM

## 2018-07-23 DIAGNOSIS — E7209 Other disorders of amino-acid transport: Secondary | ICD-10-CM

## 2018-07-23 DIAGNOSIS — Z23 Encounter for immunization: Secondary | ICD-10-CM | POA: Diagnosis not present

## 2018-07-23 DIAGNOSIS — Z79899 Other long term (current) drug therapy: Secondary | ICD-10-CM

## 2018-07-23 DIAGNOSIS — Z113 Encounter for screening for infections with a predominantly sexual mode of transmission: Secondary | ICD-10-CM

## 2018-07-23 DIAGNOSIS — Z716 Tobacco abuse counseling: Secondary | ICD-10-CM | POA: Diagnosis not present

## 2018-07-23 MED ORDER — DARUNAVIR-COBICISTAT 800-150 MG PO TABS: 1.0000 | ORAL_TABLET | Freq: Every day | ORAL | 5 refills | Status: DC

## 2018-07-23 MED ORDER — DOLUTEGRAVIR-RILPIVIRINE 50-25 MG PO TABS: 1.0000 | ORAL_TABLET | Freq: Every day | ORAL | 5 refills | Status: DC

## 2018-07-23 NOTE — Assessment & Plan Note (Addendum)
Appreciate Dr. Elza Rafter recommendations.  We will continue to monitor twice yearly renal function panels and urinalysis to assess for glycosuria 1-2 times per year.  We will continued tenofovir sparing regimen.

## 2018-07-23 NOTE — Progress Notes (Signed)
Patient Name: William [name]  Date of Birth: 1965/07/05  MRN: [id number]  PCP: Dionne Ano, MD    Patient Active Problem List   Diagnosis Date Noted  . 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 Low body weight due to inadequate caloric intake 02/11/2015  . Preventative health care 10/22/2014  . Dyslipidemia 10/22/2014  . CIGARETTE SMOKER 06/02/2008  . Human immunodeficiency virus (HIV) disease (HCC) 01/01/2008    SUBJECTIVE: Brief Narrative:  William [name] is a 53 y.o. AA male with HIV, Dx in 2009.  HIV Risk: heterosexual contact.  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    CC:  Routine HIV follow-up care.  No complaints or concerns.  HPI: William [name] is doing very well since her last office visit together.  He tells me that he is still working from time to time at his own pace but mostly staying inside abiding by social distancing protocols for COVID-19 pandemic.  Most his family lives up in [place] but he visits them from time to time.  He continues to smoke half a pack a day but feels that he has decreased this to even less now that he is home and can find other things to keep himself busy.  He reports 100% adherence with his Juluca and Prezcobix regimen.  He is taking this correctly with food every day at the same time.  He is separating his multivitamins by at least 6 hours.  He has no concerns with side effects, access to medications and feels that they are working well for him.  Not currently sexually active.  No new partners in several years.  He has had follow-up with Dr. Eliott Nine with [facility] with concern over Fanconi syndrome related to tenofovir  use.  He says he has follow-up with her in the future if needed.  He is currently working with his primary care clinic to get a new doctor for wellness and preventative care.   Review of Systems  Constitutional: Negative for chills, fever, malaise/fatigue and weight loss.  HENT: Negative for sore throat.        No dental problems  Respiratory: Negative for cough and sputum production.   Cardiovascular: Negative for chest pain and leg swelling.  Gastrointestinal: Negative for abdominal pain, diarrhea and vomiting.  Genitourinary: Negative for dysuria and flank pain.  Musculoskeletal: Negative for joint pain, myalgias and neck pain.  Skin: Negative for rash.  Neurological: Negative for dizziness, tingling and headaches.  Psychiatric/Behavioral: Negative for depression and substance abuse. The patient is not nervous/anxious and does not have insomnia.     Past Medical History:  Diagnosis Date  . Human immunodeficiency virus (HIV) disease (HCC) 01/01/2008   Dx 2009  10/2016 - E92Q INSTI mutation (DTG sensitive) developed on Genvoya    . Hyperlipidemia 10/22/2014   Outpatient Medications Prior to Visit  Medication Sig Dispense Refill  . Multiple Vitamin (MULTIVITAMIN) tablet Take 1 tablet by mouth daily.    . darunavir-cobicistat (PREZCOBIX) 800-150 MG tablet Take 1 tablet by mouth daily with breakfast. Swallow whole. Do NOT crush, break or chew tablets. Take with food. 30 tablet 5  . Dolutegravir-Rilpivirine (JULUCA) 50-25 MG TABS Take 1 tablet by mouth  daily. 30 tablet 5   No facility-administered medications prior to visit.    No Known Allergies  OBJECTIVE:  Vitals:   07/23/18 0841  BP: 124/80  Pulse: 64  Temp: 97.6 F (36.4 C)  Weight: 147 lb (66.7 kg)   Body mass index is 20.5 kg/m.  Physical Exam  Constitutional: He is oriented to person, place, and time and well-developed, well-nourished, and in no distress.  HENT:  Mouth/Throat: No oral lesions. Normal dentition. No  dental caries.  Eyes: No scleral icterus.  Cardiovascular: Normal rate, regular rhythm and normal heart sounds.  Pulmonary/Chest: Effort normal and breath sounds normal.  Abdominal: Soft. He exhibits no distension. There is no abdominal tenderness.  Lymphadenopathy:    He has no cervical adenopathy.  Neurological: He is alert and oriented to person, place, and time.  Skin: Skin is warm and dry. No rash noted.  Psychiatric: Mood and affect normal.   Lab Results Lab Results  Component Value Date   WBC 6.8 07/09/2018   HGB 14.9 07/09/2018   HCT 45.1 07/09/2018   MCV 78.2 (L) 07/09/2018   PLT 249 07/09/2018    Lab Results  Component Value Date   CREATININE 1.29 07/09/2018   BUN 11 07/09/2018   NA 137 07/09/2018   K 4.0 07/09/2018   CL 106 07/09/2018   CO2 25 07/09/2018    Lab Results  Component Value Date   ALT 9 07/09/2018   AST 16 07/09/2018   ALKPHOS 75 09/29/2014   BILITOT 0.3 07/09/2018    Lab Results  Component Value Date   CHOL 204 (H) 01/14/2018   HDL 52 01/14/2018   LDLCALC 124 (H) 01/14/2018   TRIG 162 (H) 01/14/2018   CHOLHDL 3.9 01/14/2018   HIV 1 RNA Quant (copies/mL)  Date Value  07/09/2018 <20 NOT DETECTED  02/26/2018 <20 NOT DETECTED  01/14/2018 115 (H)   HIV-1 RNA Viral Load (copies/mL)  Date Value  10/17/2016 29,400   CD4 T Cell Abs (/uL)  Date Value  07/09/2018 220 (L)  02/26/2018 260 (L)  01/14/2018 240 (L)    A&P:   Problem List Items Addressed This Visit      Unprioritized   CIGARETTE SMOKER (Chronic)    He has been smoking about half a pack of cigarettes now for a while.  We discussed that he may be best served by quitting cold [place].  We discussed picking a quit date and other tactics to use for nicotine replacement therapy.  He has used the patches in the past that have helped.  He will plan on pursuing this since he has time with current pandemic.  I am hopeful that when he returns in 6 months he will stop.        Fanconi-like syndrome (HCC)    Appreciate Dr. Elza Rafter recommendations.  We will continue to monitor twice yearly renal function panels and urinalysis to assess for glycosuria 1-2 times per year.  We will continued tenofovir sparing regimen.      Relevant Orders   Urinalysis   Renal Function Panel   Human immunodeficiency virus (HIV) disease (HCC) - Primary (Chronic)    Norbert is doing extremely well on his salvage regimen of Juluca and Prezcobix once daily.  He has high level integrates mutation due to poor adherence with Genvoya.  His current regimen has continued to prove effective with serial undetectable viral loads.  His immune system has been slow to reconstitute with T cells hovering around 220.  We  discussed this today.  He has no concerning signs for opportunistic infection on exam.  No need for opportunistic prophylaxis. We will screen for STIs with basic urine gonorrhea and chlamydia and serologic RPR at upcoming office visit. Return in 6 months with labs prior to.      Relevant Medications   darunavir-cobicistat (PREZCOBIX) 800-150 MG tablet   Dolutegravir-Rilpivirine (JULUCA) 50-25 MG TABS   Other Relevant Orders   HIV-1 RNA quant-no reflex-bld   T-helper cell (CD4)- ([facility] clinic only)   MENINGOCOCCAL MCV4O (Completed)   Preventative health care    He has not yet had a colonoscopy but is working on setting that up with his new PCP.  He is of normal body weight, blood pressure within normal range.  Smoking cessation as discussed above.  We will give him his meningitis vaccine with second dose in 2 months.   We have attempted to vaccinate him against hepatitis B in the past.  However based on lab work drawn in 2018 he does not have immunity.  We will discuss at upcoming office visit to initiate double dose of the series.  Now that his CD4 count is above 200 consistently am hopeful that this will be more likely to take.       Other Visit Diagnoses    Routine screening for STI  (sexually transmitted infection)       Relevant Orders   RPR   Urine cytology ancillary only   High risk medication use       Relevant Orders   Lipid panel   Need for meningococcus vaccine       Relevant Orders   MENINGOCOCCAL MCV4O (Completed)       Rexene AlbertsStephanie Dixon, MSN, NP-C [facility]

## 2018-07-23 NOTE — Assessment & Plan Note (Signed)
He has not yet had a colonoscopy but is working on setting that up with his new PCP.  He is of normal body weight, blood pressure within normal range.  Smoking cessation as discussed above.  We will give him his meningitis vaccine with second dose in 2 months.   We have attempted to vaccinate him against hepatitis B in the past.  However based on lab work drawn in 2018 he does not have immunity.  We will discuss at upcoming office visit to initiate double dose of the series.  Now that his CD4 count is above 200 consistently am hopeful that this will be more likely to take.

## 2018-07-23 NOTE — Assessment & Plan Note (Signed)
He has been smoking about half a pack of cigarettes now for a while.  We discussed that he may be best served by quitting cold [place].  We discussed picking a quit date and other tactics to use for nicotine replacement therapy.  He has used the patches in the past that have helped.  He will plan on pursuing this since he has time with current pandemic.  I am hopeful that when he returns in 6 months he will stop.

## 2018-07-23 NOTE — Assessment & Plan Note (Signed)
William [name] is doing extremely well on his salvage regimen of Juluca and Prezcobix once daily.  He has high level integrates mutation due to poor adherence with Genvoya.  His current regimen has continued to prove effective with serial undetectable viral loads.  His immune system has been slow to reconstitute with T cells hovering around 220.  We discussed this today.  He has no concerning signs for opportunistic infection on exam.  No need for opportunistic prophylaxis. We will screen for STIs with basic urine gonorrhea and chlamydia and serologic RPR at upcoming office visit. Return in 6 months with labs prior to.

## 2018-07-23 NOTE — Patient Instructions (Signed)
Always a pleasure to see William [name]  Your viral load remains undetectable.  Your Juluca and Prezcobix are working nicely to keep your condition under excellent control.  Please continue taking both these pills once daily with food as you have been doing.  We will give your updated meningitis vaccine today.  Please stop by in 2 months for a nurse visit to get the second dose   Your blood pressure looks excellent your weight looks great the only thing I would suggest to work on would be to stop smoking.  I am confident that once you make the decision to stop this will be something that you can maintain as you have in the past.  I am glad to hear that you are working to get a new primary care provider.  This will be helpful to make certain you have everything you need to stay healthy and well.  We will see you back in the office in 6 months with labs prior to your visit.

## 2018-09-08 ENCOUNTER — Encounter: Payer: Self-pay | Admitting: *Deleted

## 2018-09-23 ENCOUNTER — Other Ambulatory Visit: Payer: Self-pay

## 2018-09-23 ENCOUNTER — Ambulatory Visit (INDEPENDENT_AMBULATORY_CARE_PROVIDER_SITE_OTHER): Payer: Medicaid Other | Admitting: *Deleted

## 2018-09-23 DIAGNOSIS — Z23 Encounter for immunization: Secondary | ICD-10-CM

## 2018-11-26 ENCOUNTER — Other Ambulatory Visit: Payer: Self-pay

## 2018-11-26 ENCOUNTER — Encounter: Payer: Self-pay | Admitting: Internal Medicine

## 2018-11-26 ENCOUNTER — Ambulatory Visit: Payer: Medicaid Other | Admitting: Internal Medicine

## 2018-11-26 VITALS — BP 114/80 | HR 58 | Temp 98.4°F | Ht 71.0 in | Wt 142.5 lb

## 2018-11-26 DIAGNOSIS — R81 Glycosuria: Secondary | ICD-10-CM

## 2018-11-26 DIAGNOSIS — Z23 Encounter for immunization: Secondary | ICD-10-CM | POA: Diagnosis not present

## 2018-11-26 DIAGNOSIS — F17210 Nicotine dependence, cigarettes, uncomplicated: Secondary | ICD-10-CM

## 2018-11-26 DIAGNOSIS — Z79899 Other long term (current) drug therapy: Secondary | ICD-10-CM

## 2018-11-26 DIAGNOSIS — B20 Human immunodeficiency virus [HIV] disease: Secondary | ICD-10-CM | POA: Diagnosis not present

## 2018-11-26 DIAGNOSIS — E785 Hyperlipidemia, unspecified: Secondary | ICD-10-CM | POA: Diagnosis not present

## 2018-11-26 DIAGNOSIS — Z0000 Encounter for general adult medical examination without abnormal findings: Secondary | ICD-10-CM

## 2018-11-26 DIAGNOSIS — E7209 Other disorders of amino-acid transport: Secondary | ICD-10-CM | POA: Diagnosis not present

## 2018-11-26 DIAGNOSIS — F17200 Nicotine dependence, unspecified, uncomplicated: Secondary | ICD-10-CM

## 2018-11-26 LAB — POCT GLYCOSYLATED HEMOGLOBIN (HGB A1C): Hemoglobin A1C: 5.7 % — AB (ref 4.0–5.6)

## 2018-11-26 LAB — GLUCOSE, CAPILLARY: Glucose-Capillary: 86 mg/dL (ref 70–99)

## 2018-11-26 NOTE — Assessment & Plan Note (Signed)
Patient does not wish to precede with colonoscopy at this time and would rather do FIT testing yearly. Patient given instructions for iFOBT and to mail in.   Flu vaccine given at this visit.

## 2018-11-26 NOTE — Assessment & Plan Note (Signed)
Patient evaluated by Dr. 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 Future labs ordered by Janene Madeira, NP with ID with plans to continue tenofovir sparing regimen.   - Continue to monitor  - F/u renal function labs and UA

## 2018-11-26 NOTE — Patient Instructions (Addendum)
William [name],  It was a pleasure seeing you in clinic today! Please continue to take your HIV medications as prescribed and follow up with ID.   I have ordered a feccal occult blood test for colon cancer screening. Please follow up with that and send to lab as instructed.   We also discussed smoking cessation at this visit. Please let us know if we can be of any assistance to you in your journey to quitting smoking.  Please contact us if you have any concerns.   Thank you!

## 2018-11-26 NOTE — Progress Notes (Signed)
   CC: f/u colonoscopy and dyslipidemia   HPI:  [name] is a 53 y.o. male with Hx of HIV (well controlled) presenting to clinic today for follow up on dyslipidemia and colon cancer screening. Patient has no acute concerns at this visit. Please see problem based charting for further assessment and plan.   Past Medical History:  Diagnosis Date  . Human immunodeficiency virus (HIV) disease ([place]) 01/01/2008   Dx 2009  10/2016 - E92Q INSTI mutation (DTG sensitive) developed on Genvoya    . Hyperlipidemia 10/22/2014   Review of Systems:   Review of Systems  Constitutional: Negative for chills, fever, malaise/fatigue and weight loss.  Respiratory: Negative for cough, shortness of breath and wheezing.   Cardiovascular: Negative for chest pain and palpitations.  Gastrointestinal: Negative for abdominal pain, constipation, diarrhea, nausea and vomiting.  Musculoskeletal: Negative for back pain, joint pain and myalgias.  Skin: Negative for itching and rash.  Neurological: Negative for dizziness, tingling, sensory change, focal weakness, weakness and headaches.     Physical Exam:  Vitals:   11/26/18 1415  BP: 114/80  Pulse: (!) 58  Temp: 98.4 F (36.9 C)  TempSrc: Oral  SpO2: 100%  Weight: 142 lb 8 oz (64.6 kg)  Height: 5\' 11"  (1.803 m)   Physical Exam Constitutional:      General: He is not in acute distress.    Appearance: Normal appearance. He is not ill-appearing.  Cardiovascular:     Rate and Rhythm: Normal rate and regular rhythm.     Pulses: Normal pulses.     Heart sounds: Normal heart sounds. No murmur. No friction rub. No gallop.   Pulmonary:     Effort: Pulmonary effort is normal. No respiratory distress.     Breath sounds: Normal breath sounds. No wheezing or rhonchi.  Abdominal:     General: Abdomen is flat. Bowel sounds are normal. There is no distension.     Palpations: Abdomen is soft.     Tenderness: There is no abdominal tenderness. There is no  guarding.  Musculoskeletal: Normal range of motion.  Skin:    General: Skin is warm and dry.     Capillary Refill: Capillary refill takes less than 2 seconds.  Neurological:     General: No focal deficit present.     Mental Status: He is alert and oriented to person, place, and time. Mental status is at baseline.     Cranial Nerves: No cranial nerve deficit.     Sensory: No sensory deficit.     Motor: No weakness.     Assessment & Plan:   See Encounters Tab for problem based charting.  Patient seen with Dr. Angelia Mould

## 2018-11-26 NOTE — Assessment & Plan Note (Signed)
Patient ASCVD risk 8.4% based on lipid panel in October 2019.   - TSH, CK, Liver enzymes at next visit prior to starting statin therapy.  - Lipid panel at next visit with ID

## 2018-11-26 NOTE — Assessment & Plan Note (Signed)
Patient on Prezcobix and Junuva and tolerating well with undetectable viral loads and CD4 count >200. Patient denies any side effects.   - Continue current regimen

## 2018-11-26 NOTE — Assessment & Plan Note (Signed)
Patient smokes 1/2 ppd for past 7 years. He reports long term history of smoking but has previously quit cold [place] for 3 years. He reports that he smokes out of habit rather than dependence. However, he is not currently ready to quit. Patient was offered nicotine patch but he does not wish to pursue this currently. Will continue to monitor and encourage smoking cessation.

## 2018-11-29 NOTE — Progress Notes (Signed)
Internal Medicine Clinic Attending  I saw and evaluated the patient.  I personally confirmed the key portions of the history and exam documented by Dr. Aslam and I reviewed pertinent patient test results.  The assessment, diagnosis, and plan were formulated together and I agree with the documentation in the resident's note.     

## 2019-01-16 ENCOUNTER — Other Ambulatory Visit: Payer: Self-pay

## 2019-01-16 ENCOUNTER — Other Ambulatory Visit: Payer: Self-pay | Admitting: Infectious Diseases

## 2019-01-16 DIAGNOSIS — B20 Human immunodeficiency virus [HIV] disease: Secondary | ICD-10-CM

## 2019-01-16 MED ORDER — JULUCA 50-25 MG PO TABS: 1.0000 | ORAL_TABLET | Freq: Every day | ORAL | 3 refills | Status: DC

## 2019-01-23 ENCOUNTER — Other Ambulatory Visit: Payer: Medicaid Other

## 2019-01-23 ENCOUNTER — Other Ambulatory Visit (HOSPITAL_COMMUNITY)
Admission: RE | Admit: 2019-01-23 | Discharge: 2019-01-23 | Disposition: A | Discharge status: Home / Self Care | Payer: Medicaid Other | Source: Ambulatory Visit | Attending: Infectious Diseases | Admitting: Infectious Diseases

## 2019-01-23 ENCOUNTER — Other Ambulatory Visit: Payer: Self-pay

## 2019-01-23 DIAGNOSIS — B20 Human immunodeficiency virus [HIV] disease: Secondary | ICD-10-CM | POA: Diagnosis not present

## 2019-01-23 DIAGNOSIS — Z113 Encounter for screening for infections with a predominantly sexual mode of transmission: Secondary | ICD-10-CM

## 2019-01-23 DIAGNOSIS — Z79899 Other long term (current) drug therapy: Secondary | ICD-10-CM | POA: Diagnosis not present

## 2019-01-23 DIAGNOSIS — E7209 Other disorders of amino-acid transport: Secondary | ICD-10-CM

## 2019-01-23 LAB — URINALYSIS
Bilirubin Urine: NEGATIVE
Hgb urine dipstick: NEGATIVE
Ketones, ur: NEGATIVE
Leukocytes,Ua: NEGATIVE
Nitrite: NEGATIVE
Protein, ur: NEGATIVE
Specific Gravity, Urine: 1.021 (ref 1.001–1.03)
pH: <=5 (ref 5.0–8.0)

## 2019-01-24 LAB — T-HELPER CELL (CD4) - (RCID CLINIC ONLY)
CD4 % Helper T Cell: 7 % — ABNORMAL LOW (ref 33–65)
CD4 T Cell Abs: 180 /uL — ABNORMAL LOW (ref 400–1790)

## 2019-01-28 LAB — RENAL FUNCTION PANEL
Albumin: 4.2 g/dL (ref 3.6–5.1)
BUN: 13 mg/dL (ref 7–25)
CO2: 21 mmol/L (ref 20–32)
Calcium: 9.4 mg/dL (ref 8.6–10.3)
Chloride: 106 mmol/L (ref 98–110)
Creat: 1.27 mg/dL (ref 0.70–1.33)
Glucose, Bld: 102 mg/dL — ABNORMAL HIGH (ref 65–99)
Phosphorus: 3.6 mg/dL (ref 2.5–4.5)
Potassium: 4.2 mmol/L (ref 3.5–5.3)
Sodium: 139 mmol/L (ref 135–146)

## 2019-01-28 LAB — RPR: RPR Ser Ql: NONREACTIVE

## 2019-01-28 LAB — LIPID PANEL
Cholesterol: 185 mg/dL (ref <200)
HDL: 57 mg/dL (ref >=40)
LDL Cholesterol (Calc): 109 mg/dL (calc) — ABNORMAL HIGH
Non-HDL Cholesterol (Calc): 128 mg/dL (calc) (ref <130)
Total CHOL/HDL Ratio: 3.2 (calc) (ref <5.0)
Triglycerides: 97 mg/dL (ref <150)

## 2019-01-28 LAB — HIV-1 RNA QUANT-NO REFLEX-BLD
HIV 1 RNA Quant: <20 copies/mL
HIV-1 RNA Quant, Log: <1.3 Log copies/mL

## 2019-02-05 ENCOUNTER — Telehealth: Payer: Self-pay

## 2019-02-05 NOTE — Telephone Encounter (Signed)
COVID-19 Pre-Screening Questions:02/05/19   Do you currently have a fever (>100 F), chills or unexplained body aches? NO  Are you currently experiencing new cough, shortness of breath, sore throat, runny nose?NO  .  Have you recently travelled outside the state of [place] in the last 14 days? NO .  Have you been in contact with someone that is currently pending confirmation of Covid19 testing or has been confirmed to have the Covid19 virus? NO  **If the patient answers NO to ALL questions -  advise the patient to please call the clinic before coming to the office should any symptoms develop.     

## 2019-02-06 ENCOUNTER — Encounter: Payer: Self-pay | Admitting: Infectious Diseases

## 2019-02-06 ENCOUNTER — Ambulatory Visit (INDEPENDENT_AMBULATORY_CARE_PROVIDER_SITE_OTHER): Payer: Medicaid Other | Admitting: Infectious Diseases

## 2019-02-06 ENCOUNTER — Other Ambulatory Visit: Payer: Self-pay

## 2019-02-06 VITALS — BP 119/78 | HR 74 | Temp 97.6°F | Wt 144.0 lb

## 2019-02-06 DIAGNOSIS — E7209 Other disorders of amino-acid transport: Secondary | ICD-10-CM

## 2019-02-06 DIAGNOSIS — F17200 Nicotine dependence, unspecified, uncomplicated: Secondary | ICD-10-CM | POA: Diagnosis not present

## 2019-02-06 DIAGNOSIS — Z9229 Personal history of other drug therapy: Secondary | ICD-10-CM | POA: Diagnosis not present

## 2019-02-06 DIAGNOSIS — B20 Human immunodeficiency virus [HIV] disease: Secondary | ICD-10-CM

## 2019-02-06 MED ORDER — PREZCOBIX 800-150 MG PO TABS: ORAL_TABLET | ORAL | 11 refills | Status: DC

## 2019-02-06 MED ORDER — JULUCA 50-25 MG PO TABS: 1.0000 | ORAL_TABLET | Freq: Every day | ORAL | 11 refills | Status: DC

## 2019-02-06 NOTE — Patient Instructions (Signed)
Nice to see you!  Please continue your Juluca and Prezcobix every day as you are - your labs all look great!  Please return in 2 months to get your second dose of Hepatitis B vaccine.   Please return for a routine follow up visit again in 6 months - we can schedule this as a Video Visit and have you come for blood work 2 weeks before.

## 2019-02-06 NOTE — Progress Notes (Signed)
Patient Name: William [name]  Date of Birth: 11/02/1965  MRN: [id number]  PCP: Harvie Heck, MD    Patient Active Problem List   Diagnosis Date Noted  . Human immunodeficiency virus (HIV) disease ([place]) 01/01/2008    Priority: High  . 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 Fanconi-like syndrome ([place]) 10/28/2016  . Low body weight due to inadequate caloric intake 02/11/2015  . Preventative health care 10/22/2014  . Dyslipidemia 10/22/2014  . CIGARETTE SMOKER 06/02/2008    SUBJECTIVE: Brief Narrative:  Geza is a 53 y.o. AA male with HIV, Dx in 2009.  HIV Risk: heterosexual contact.  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    CC:  Routine HIV follow-up care.  No complaints or concerns.  HPI: Akiem is doing very well and states he feels so much better being back on his medications consistently. He reports 100% adherence with his Juluca and Prezcobix regimen.  He is taking this correctly with food every day at the same time.  He is separating his multivitamins by at least 6 hours.  He has no concerns with side effects, access to medications and feels that they are working well for him.  Not currently sexually active.  No new partners in several years. He is working from time to time at home at his leisure and necessity. No changes to his health otherwise. Has a primary care team. He is still smoking a few cigarettes a week.    Review of Systems  Constitutional: Negative for chills, fever, malaise/fatigue and weight loss.  HENT: Negative for sore throat.        No dental problems  Respiratory: Negative for cough and sputum production.    Cardiovascular: Negative for chest pain and leg swelling.  Gastrointestinal: Negative for abdominal pain, diarrhea and vomiting.  Genitourinary: Negative for dysuria and flank pain.  Musculoskeletal: Negative for joint pain, myalgias and neck pain.  Skin: Negative for rash.  Neurological: Negative for dizziness, tingling and headaches.  Psychiatric/Behavioral: Negative for depression and substance abuse. The patient is not nervous/anxious and does not have insomnia.     Past Medical History:  Diagnosis Date  . Human immunodeficiency virus (HIV) disease ([place]) 01/01/2008   Dx 2009  10/2016 - E92Q INSTI mutation (DTG sensitive) developed on Genvoya    . Hyperlipidemia 10/22/2014   Outpatient Medications Prior to Visit  Medication Sig Dispense Refill  . Multiple Vitamin (MULTIVITAMIN) tablet Take 1 tablet by mouth daily.    . Dolutegravir-Rilpivirine (JULUCA) 50-25 MG TABS Take 1 tablet by mouth daily. 30 tablet 3  . PREZCOBIX 800-150 MG tablet TAKE 1 TABLET BY MOUTH DAILY WITH BREAKFAST SWALLOW WHOLE DO NOT CRUSH, BREAK OR CHEW TABS 30 tablet 3   No facility-administered medications prior to visit.    No Known Allergies  OBJECTIVE:  Vitals:   02/06/19 0902  BP: 119/78  Pulse: 74  Temp: 97.6 F (36.4 C)  TempSrc: Oral  Weight: 144 lb (65.3 kg)   Body mass index is 20.08 kg/m.  Physical Exam  Constitutional: He is oriented to person, place, and time and well-developed, well-nourished, and in no distress.  HENT:  Mouth/Throat: No oral lesions. Normal dentition.  No dental caries.  Eyes: No scleral icterus.  Cardiovascular: Normal rate, regular rhythm and normal heart sounds.  Pulmonary/Chest: Effort normal and breath sounds normal.  Abdominal: Soft. He exhibits no distension. There is no abdominal tenderness.  Lymphadenopathy:    He has no cervical adenopathy.  Neurological: He is alert and oriented to person, place, and time.  Skin: Skin is warm and dry. No rash noted.   Psychiatric: Mood and affect normal.    Lab Results Lab Results  Component Value Date   WBC 6.8 07/09/2018   HGB 14.9 07/09/2018   HCT 45.1 07/09/2018   MCV 78.2 (L) 07/09/2018   PLT 249 07/09/2018    Lab Results  Component Value Date   CREATININE 1.27 01/23/2019   BUN 13 01/23/2019   NA 139 01/23/2019   K 4.2 01/23/2019   CL 106 01/23/2019   CO2 21 01/23/2019    Lab Results  Component Value Date   ALT 9 07/09/2018   AST 16 07/09/2018   ALKPHOS 75 09/29/2014   BILITOT 0.3 07/09/2018    Lab Results  Component Value Date   CHOL 185 01/23/2019   HDL 57 01/23/2019   LDLCALC 109 (H) 01/23/2019   TRIG 97 01/23/2019   CHOLHDL 3.2 01/23/2019   HIV 1 RNA Quant (copies/mL)  Date Value  01/23/2019 <20 NOT DETECTED  07/09/2018 <20 NOT DETECTED  02/26/2018 <20 NOT DETECTED   HIV-1 RNA Viral Load (copies/mL)  Date Value  10/17/2016 29,400   CD4 T Cell Abs (/uL)  Date Value  01/23/2019 180 (L)  07/09/2018 220 (L)  02/26/2018 260 (L)   Assessment & Plan:   Problem List Items Addressed This Visit      High   Human immunodeficiency virus (HIV) disease (HCC) - Primary (Chronic)    Beckam's adherence has improved significantly since working together. I congratulated him and encouraged him to continue his hard work. His CD4 is right around 200 in general. No need for OI prophylaxis given ongoing perfect viral suppression.   He can come back in 6 months for follow up for Virtual Visit with labs prior to.       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 He will receive 1st dose  today and follow up in 2 months for second. Check antibody at next visit.       Relevant Orders   Hepatitis B surface antibody   Fanconi-like syndrome (HCC)    Stable urine glucose @ 3+, normal serum renal profile otherwise. Continue with yearly monitoring.       CIGARETTE SMOKER (Chronic)    I encouraged him to pick a quit date for 2021 and commit to no cigarettes.          [name], MSN, NP-C [facility]  [email or web address] Pager: [phone] Office: [phone] [facility] Main Line: [phone]

## 2019-02-06 NOTE — Addendum Note (Signed)
Addended by: Aundria Rud on: 02/06/2019 11:49 AM   Modules accepted: Orders

## 2019-02-06 NOTE — Assessment & Plan Note (Signed)
Will try to vaccinate with Heplisav 2 dose regimen to see if he will develop antibodies - previously may have been d/t uncontrolled HIV and low CD4. He will receive 1st dose today and follow up in 2 months for second. Check antibody at next visit.

## 2019-02-06 NOTE — Assessment & Plan Note (Signed)
Stable urine glucose @ 3+, normal serum renal profile otherwise. Continue with yearly monitoring.

## 2019-02-06 NOTE — Assessment & Plan Note (Signed)
William [name]'s adherence has improved significantly since working together. I congratulated him and encouraged him to continue his hard work. His CD4 is right around 200 in general. No need for OI prophylaxis given ongoing perfect viral suppression.   He can come back in 6 months for follow up for Virtual Visit with labs prior to.

## 2019-02-06 NOTE — Assessment & Plan Note (Signed)
I encouraged him to pick a quit date for 2021 and commit to no cigarettes.

## 2019-02-10 LAB — MOLECULAR ANCILLARY ONLY
Chlamydia: NEGATIVE
Comment: NEGATIVE
Comment: NORMAL
Neisseria Gonorrhea: NEGATIVE

## 2019-04-07 ENCOUNTER — Telehealth: Payer: Self-pay

## 2019-04-07 NOTE — Telephone Encounter (Signed)
COVID-19 Pre-Screening Questions:04/07/19  Do you currently have a fever (>100 F), chills or unexplained body aches? NO   . Are you currently experiencing new cough, shortness of breath, sore throat, runny nose? NO .  Have you recently travelled outside the state of [place] in the last 14 days? NO .  Have you been in contact with someone that is currently pending confirmation of Covid19 testing or has been confirmed to have the Covid19 virus?  NO  **If the patient answers NO to ALL questions -  advise the patient to please call the clinic before coming to the office should any symptoms develop.     

## 2019-04-08 ENCOUNTER — Ambulatory Visit (INDEPENDENT_AMBULATORY_CARE_PROVIDER_SITE_OTHER): Payer: Medicaid Other

## 2019-04-08 ENCOUNTER — Other Ambulatory Visit: Payer: Self-pay

## 2019-04-08 DIAGNOSIS — Z23 Encounter for immunization: Secondary | ICD-10-CM

## 2019-04-08 DIAGNOSIS — B20 Human immunodeficiency virus [HIV] disease: Secondary | ICD-10-CM

## 2019-04-08 DIAGNOSIS — Z9229 Personal history of other drug therapy: Secondary | ICD-10-CM

## 2019-04-08 NOTE — Progress Notes (Signed)
Patient returned to office today to receive 2nd HepB vaccination. Denies any complications with the first dose.  Patient tolerated vaccination well. Valarie Cones

## 2019-08-06 ENCOUNTER — Other Ambulatory Visit: Payer: Medicaid Other

## 2019-08-21 ENCOUNTER — Encounter: Payer: Self-pay | Admitting: Infectious Diseases

## 2019-08-21 ENCOUNTER — Ambulatory Visit (INDEPENDENT_AMBULATORY_CARE_PROVIDER_SITE_OTHER): Payer: Medicaid Other | Admitting: Infectious Diseases

## 2019-08-21 ENCOUNTER — Other Ambulatory Visit: Payer: Self-pay

## 2019-08-21 VITALS — BP 111/77 | HR 76 | Wt 143.2 lb

## 2019-08-21 DIAGNOSIS — Z792 Long term (current) use of antibiotics: Secondary | ICD-10-CM | POA: Diagnosis not present

## 2019-08-21 DIAGNOSIS — B20 Human immunodeficiency virus [HIV] disease: Secondary | ICD-10-CM | POA: Diagnosis not present

## 2019-08-21 DIAGNOSIS — Z9229 Personal history of other drug therapy: Secondary | ICD-10-CM

## 2019-08-21 DIAGNOSIS — Z113 Encounter for screening for infections with a predominantly sexual mode of transmission: Secondary | ICD-10-CM

## 2019-08-21 DIAGNOSIS — Z79899 Other long term (current) drug therapy: Secondary | ICD-10-CM

## 2019-08-21 DIAGNOSIS — F17200 Nicotine dependence, unspecified, uncomplicated: Secondary | ICD-10-CM | POA: Diagnosis not present

## 2019-08-21 DIAGNOSIS — E7209 Other disorders of amino-acid transport: Secondary | ICD-10-CM | POA: Diagnosis not present

## 2019-08-21 DIAGNOSIS — Z0000 Encounter for general adult medical examination without abnormal findings: Secondary | ICD-10-CM

## 2019-08-21 NOTE — Patient Instructions (Addendum)
I am glad to hear that you are staying well and keeping healthy and always nice to see you today.  Your medication is working perfectly for you and you are doing a great job taking it as prescribed  Please continue your JULUCA and PREZCOBIX once a day with food as you are taking now.   Please stop by the lab on your way out to provide blood and urine sample.   Vaccines Recommended:   COVID19 vaccine   For your COVID Vaccine -  There are 3 options out currently to consider:  1. Pfizer - 2 doses 3 weeks apart, 95% protective  2. Moderna - 2 doses 4 weeks apart, 95% protective  3. Johnsen & Johnsen - 1 dose, 70% protective   To schedule:  Text "[email or web address]" to [phone] to get a link to schedule through the [facility] Department.   [email or web address] --> information to register through our system  Local [facility] (not all but they can help you find one)    Recommended Next Office Visit:   6 months with lab follow up 2 weeks before please    Please continue to be well, stay away from folks that are sick, wash her hands frequently, do not touch her face or mouth and continue to take your medications.

## 2019-08-21 NOTE — Assessment & Plan Note (Signed)
Nearly quit but hanging on to a few cigarettes a week. Encouragement provided for cessation.

## 2019-08-21 NOTE — Progress Notes (Signed)
Patient Name: William [name]  Date of Birth: 1965-11-16  MRN: [id number]  PCP: Harvie Heck, MD    Patient Active Problem List   Diagnosis Date Noted  . Human immunodeficiency virus (HIV) disease ([place]) 01/01/2008    Priority: High  . 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 Low body weight due to inadequate caloric intake 02/11/2015  . Preventative health care 10/22/2014  . Dyslipidemia 10/22/2014  . CIGARETTE SMOKER 06/02/2008    SUBJECTIVE: Brief Narrative:  William [name] is a 54 y.o. AA male with HIV, Dx in 2009.  HIV Risk: heterosexual contact.  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     CC:  Routine HIV follow-up care.   No complaints or concerns.    HPI: William [name] has been doing well. Taking his Juluca and Prezcobix everyday with food. No new medications or OTC medications to declare. No concerns over side effects or access to his medications.   Eating and sleeping well. No changes to weight or activity.  Not currently sexually active.  No new partners in several years. He is working from time to time at home at his leisure and necessity. No changes to his health otherwise. Has a primary care team and an upcoming appointment soon to discuss colon cancer screening. He is still smoking a few cigarettes a week.  He has not yet received COVID19 vaccine but would like information about it.    Review of Systems  Constitutional: Negative for chills, fever, malaise/fatigue and weight loss.  HENT: Negative for sore throat.        No dental problems  Respiratory: Negative for cough and sputum  production.   Cardiovascular: Negative for chest pain and leg swelling.  Gastrointestinal: Negative for abdominal pain, diarrhea and vomiting.  Genitourinary: Negative for dysuria and flank pain.  Musculoskeletal: Negative for joint pain, myalgias and neck pain.  Skin: Negative for rash.  Neurological: Negative for dizziness, tingling and headaches.  Psychiatric/Behavioral: Negative for depression and substance abuse. The patient is not nervous/anxious and does not have insomnia.     Past Medical History:  Diagnosis Date  . Human immunodeficiency virus (HIV) disease ([place]) 01/01/2008   Dx 2009  10/2016 - E92Q INSTI mutation (DTG sensitive) developed on Genvoya    . Hyperlipidemia 10/22/2014   Outpatient Medications Prior to Visit  Medication Sig Dispense Refill  . darunavir-cobicistat (PREZCOBIX) 800-150 MG tablet TAKE 1 TABLET BY MOUTH DAILY WITH BREAKFAST SWALLOW WHOLE DO NOT CRUSH, BREAK OR CHEW TABS 30 tablet 11  . Dolutegravir-Rilpivirine (JULUCA) 50-25 MG TABS Take 1 tablet by mouth daily. 30 tablet 11  . Multiple Vitamin (MULTIVITAMIN) tablet Take 1 tablet by mouth daily.     No facility-administered medications prior to visit.   No Known Allergies  OBJECTIVE:  Vitals:   08/21/19 0836  BP: 111/77  Pulse: 76  Weight: 143 lb 3.2 oz (65 kg)   Body mass index is 19.97 kg/m.  Physical Exam  Constitutional: He is oriented to person, place, and time and well-developed, well-nourished, and in no distress.  HENT:  Mouth/Throat: No oral lesions. Normal dentition. No dental caries.  Eyes: No scleral icterus.  Cardiovascular: Normal rate, regular rhythm and normal heart sounds.  Pulmonary/Chest: Effort normal and breath sounds normal.  Abdominal: Soft. He exhibits no distension. There is no abdominal tenderness.  Lymphadenopathy:    He has no cervical adenopathy.  Neurological: He is alert and oriented to person, place, and time.  Skin: Skin is warm and dry. No rash noted.   Psychiatric: Mood and affect normal.    Lab Results Lab Results  Component Value Date   WBC 6.8 07/09/2018   HGB 14.9 07/09/2018   HCT 45.1 07/09/2018   MCV 78.2 (L) 07/09/2018   PLT 249 07/09/2018    Lab Results  Component Value Date   CREATININE 1.27 01/23/2019   BUN 13 01/23/2019   NA 139 01/23/2019   K 4.2 01/23/2019   CL 106 01/23/2019   CO2 21 01/23/2019    Lab Results  Component Value Date   ALT 9 07/09/2018   AST 16 07/09/2018   ALKPHOS 75 09/29/2014   BILITOT 0.3 07/09/2018    Lab Results  Component Value Date   CHOL 185 01/23/2019   HDL 57 01/23/2019   LDLCALC 109 (H) 01/23/2019   TRIG 97 01/23/2019   CHOLHDL 3.2 01/23/2019   HIV 1 RNA Quant (copies/mL)  Date Value  01/23/2019 <20 NOT DETECTED  07/09/2018 <20 NOT DETECTED  02/26/2018 <20 NOT DETECTED   HIV-1 RNA Viral Load (copies/mL)  Date Value  10/17/2016 29,400   CD4 T Cell Abs (/uL)  Date Value  01/23/2019 180 (L)  07/09/2018 220 (L)  02/26/2018 260 (L)   Assessment & Plan:   Problem List Items Addressed This Visit      High   Human immunodeficiency virus (HIV) disease (HCC) (Chronic)    Will update pertinent labs today - CD4 has been ~200 with serial undetectable viral loads for some time. Continue Juluca + Prezcobix regimen. He will return for follow up in 6 months. No need for STI screening today - will obtain yearly urine GC/C and RPR next visit.  Lipid screen next lab draw.       Relevant Orders   Hepatitis B surface antibody,qualitative   T-helper cell (CD4)- ([facility] clinic only)   HIV-1 RNA quant-no reflex-bld   CBC   Renal Function Panel     Unprioritized   CIGARETTE SMOKER (Chronic)    Nearly quit but hanging on to a few cigarettes a week. Encouragement provided for cessation.       Preventative health care    Discussed COVID19 vaccine recommendations - information provided to schedule.       Fanconi-like syndrome (HCC)    Stable - will repeat renal panel and  urinalysis today.      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) - Primary    Check qualitative sAb today post-vaccine      Relevant Orders   HIV-1 RNA quant-no reflex-bld   T-helper cell (CD4)- ([facility] clinic only)    Other Visit Diagnoses    Routine screening for STI (sexually transmitted infection)       Relevant Orders   Urine cytology ancillary only   RPR   Encounter for long-term (current) use of antibiotics       High risk medication use       Relevant Orders   Lipid panel      [name], MSN, NP-C [facility]  [email or web address] Pager:  [phone] Office: [place]: [phone]

## 2019-08-21 NOTE — Assessment & Plan Note (Signed)
Discussed COVID19 vaccine recommendations - information provided to schedule.

## 2019-08-21 NOTE — Assessment & Plan Note (Signed)
Stable - will repeat renal panel and urinalysis today.

## 2019-08-21 NOTE — Assessment & Plan Note (Signed)
Will update pertinent labs today - CD4 has been ~200 with serial undetectable viral loads for some time. Continue Juluca + Prezcobix regimen. He will return for follow up in 6 months. No need for STI screening today - will obtain yearly urine GC/C and RPR next visit.  Lipid screen next lab draw.

## 2019-08-21 NOTE — Assessment & Plan Note (Signed)
Check qualitative sAb today post-vaccine

## 2019-08-22 LAB — T-HELPER CELL (CD4) - (RCID CLINIC ONLY)
CD4 % Helper T Cell: 11 % — ABNORMAL LOW (ref 33–65)
CD4 T Cell Abs: 255 /uL — ABNORMAL LOW (ref 400–1790)

## 2019-08-23 LAB — URINALYSIS
Bilirubin Urine: NEGATIVE
Hgb urine dipstick: NEGATIVE
Ketones, ur: NEGATIVE
Leukocytes,Ua: NEGATIVE
Nitrite: NEGATIVE
Protein, ur: NEGATIVE
Specific Gravity, Urine: 1.012 (ref 1.001–1.03)
pH: 6 (ref 5.0–8.0)

## 2019-08-23 LAB — RENAL FUNCTION PANEL
Albumin: 4.1 g/dL (ref 3.6–5.1)
BUN: 12 mg/dL (ref 7–25)
CO2: 27 mmol/L (ref 20–32)
Calcium: 9.6 mg/dL (ref 8.6–10.3)
Chloride: 107 mmol/L (ref 98–110)
Creat: 1.1 mg/dL (ref 0.70–1.33)
Glucose, Bld: 70 mg/dL (ref 65–99)
Phosphorus: 3.9 mg/dL (ref 2.5–4.5)
Potassium: 4.5 mmol/L (ref 3.5–5.3)
Sodium: 139 mmol/L (ref 135–146)

## 2019-08-23 LAB — HEPATITIS B SURFACE ANTIBODY,QUALITATIVE: Hep B S Ab: REACTIVE — AB

## 2019-08-23 LAB — HIV-1 RNA QUANT-NO REFLEX-BLD
HIV 1 RNA Quant: <20 copies/mL
HIV-1 RNA Quant, Log: <1.3 Log copies/mL

## 2020-02-09 ENCOUNTER — Other Ambulatory Visit: Payer: Self-pay | Admitting: Infectious Diseases

## 2020-02-09 DIAGNOSIS — B20 Human immunodeficiency virus [HIV] disease: Secondary | ICD-10-CM

## 2020-02-12 DIAGNOSIS — H5213 Myopia, bilateral: Secondary | ICD-10-CM | POA: Diagnosis not present

## 2020-02-26 ENCOUNTER — Other Ambulatory Visit: Payer: Self-pay

## 2020-02-26 ENCOUNTER — Other Ambulatory Visit (HOSPITAL_COMMUNITY)
Admission: RE | Admit: 2020-02-26 | Discharge: 2020-02-26 | Disposition: A | Discharge status: Home / Self Care | Payer: Medicaid Other | Source: Ambulatory Visit | Attending: Infectious Diseases | Admitting: Infectious Diseases

## 2020-02-26 ENCOUNTER — Other Ambulatory Visit: Payer: Medicaid Other

## 2020-02-26 DIAGNOSIS — Z113 Encounter for screening for infections with a predominantly sexual mode of transmission: Secondary | ICD-10-CM

## 2020-02-26 DIAGNOSIS — B20 Human immunodeficiency virus [HIV] disease: Secondary | ICD-10-CM | POA: Diagnosis not present

## 2020-02-26 DIAGNOSIS — E7209 Other disorders of amino-acid transport: Secondary | ICD-10-CM

## 2020-02-26 DIAGNOSIS — Z79899 Other long term (current) drug therapy: Secondary | ICD-10-CM

## 2020-02-26 LAB — URINALYSIS
Bilirubin Urine: NEGATIVE
Hgb urine dipstick: NEGATIVE
Ketones, ur: NEGATIVE
Leukocytes,Ua: NEGATIVE
Nitrite: NEGATIVE
Protein, ur: NEGATIVE
Specific Gravity, Urine: 1.038 — ABNORMAL HIGH (ref 1.001–1.03)
pH: <=5 (ref 5.0–8.0)

## 2020-02-27 LAB — URINE CYTOLOGY ANCILLARY ONLY
Chlamydia: NEGATIVE
Comment: NEGATIVE
Comment: NORMAL
Neisseria Gonorrhea: NEGATIVE

## 2020-02-27 LAB — T-HELPER CELL (CD4) - (RCID CLINIC ONLY)
CD4 % Helper T Cell: 12 % — ABNORMAL LOW (ref 33–65)
CD4 T Cell Abs: 316 /uL — ABNORMAL LOW (ref 400–1790)

## 2020-03-01 LAB — RENAL FUNCTION PANEL
Albumin: 4.2 g/dL (ref 3.6–5.1)
BUN/Creatinine Ratio: 11 (calc) (ref 6–22)
BUN: 15 mg/dL (ref 7–25)
CO2: 22 mmol/L (ref 20–32)
Calcium: 9.2 mg/dL (ref 8.6–10.3)
Chloride: 105 mmol/L (ref 98–110)
Creat: 1.36 mg/dL — ABNORMAL HIGH (ref 0.70–1.33)
Glucose, Bld: 106 mg/dL — ABNORMAL HIGH (ref 65–99)
Phosphorus: 4.1 mg/dL (ref 2.5–4.5)
Potassium: 4 mmol/L (ref 3.5–5.3)
Sodium: 137 mmol/L (ref 135–146)

## 2020-03-01 LAB — LIPID PANEL
Cholesterol: 229 mg/dL — ABNORMAL HIGH (ref <200)
HDL: 53 mg/dL (ref >=40)
LDL Cholesterol (Calc): 153 mg/dL (calc) — ABNORMAL HIGH
Non-HDL Cholesterol (Calc): 176 mg/dL (calc) — ABNORMAL HIGH (ref <130)
Total CHOL/HDL Ratio: 4.3 (calc) (ref <5.0)
Triglycerides: 115 mg/dL (ref <150)

## 2020-03-01 LAB — CBC
HCT: 46.1 % (ref 38.5–50.0)
Hemoglobin: 15 g/dL (ref 13.2–17.1)
MCH: 25.4 pg — ABNORMAL LOW (ref 27.0–33.0)
MCHC: 32.5 g/dL (ref 32.0–36.0)
MCV: 78.1 fL — ABNORMAL LOW (ref 80.0–100.0)
MPV: 10.2 fL (ref 7.5–12.5)
Platelets: 280 10*3/uL (ref 140–400)
RBC: 5.9 10*6/uL — ABNORMAL HIGH (ref 4.20–5.80)
RDW: 13.8 % (ref 11.0–15.0)
WBC: 5.8 10*3/uL (ref 3.8–10.8)

## 2020-03-01 LAB — HIV-1 RNA QUANT-NO REFLEX-BLD
HIV 1 RNA Quant: <20 Copies/mL
HIV-1 RNA Quant, Log: <1.3 Log cps/mL

## 2020-03-01 LAB — RPR: RPR Ser Ql: NONREACTIVE

## 2020-03-05 ENCOUNTER — Other Ambulatory Visit: Payer: Self-pay | Admitting: Infectious Diseases

## 2020-03-05 DIAGNOSIS — B20 Human immunodeficiency virus [HIV] disease: Secondary | ICD-10-CM

## 2020-03-11 ENCOUNTER — Encounter: Payer: Medicaid Other | Admitting: Infectious Diseases

## 2020-03-30 ENCOUNTER — Other Ambulatory Visit: Payer: Self-pay | Admitting: Infectious Diseases

## 2020-03-30 ENCOUNTER — Ambulatory Visit (INDEPENDENT_AMBULATORY_CARE_PROVIDER_SITE_OTHER): Payer: Medicaid Other | Admitting: Infectious Diseases

## 2020-03-30 ENCOUNTER — Other Ambulatory Visit: Payer: Self-pay

## 2020-03-30 ENCOUNTER — Encounter: Payer: Self-pay | Admitting: Infectious Diseases

## 2020-03-30 VITALS — BP 130/82 | HR 94 | Temp 97.6°F | Wt 151.0 lb

## 2020-03-30 DIAGNOSIS — E7209 Other disorders of amino-acid transport: Secondary | ICD-10-CM

## 2020-03-30 DIAGNOSIS — Z23 Encounter for immunization: Secondary | ICD-10-CM

## 2020-03-30 DIAGNOSIS — B20 Human immunodeficiency virus [HIV] disease: Secondary | ICD-10-CM

## 2020-03-30 NOTE — Patient Instructions (Addendum)
Nice to see you today!   Happy early birthday to my birthday friend :)   For your COVID booster - plan in March getting this. Can sign up at any pharmacy ([facility] should have this too).   We gave you your flu shot today.   See you back in 6 months with labs prior to   Stay well hydrated with water - drink enough to where your urine is clear to pale yellow by noon and continues like that throughout the day. Will check your kidney function again in at your return.

## 2020-03-30 NOTE — Assessment & Plan Note (Addendum)
Serum creatinine up 1.36 with normal electrolytes. Stable glycosuria and phosphorus. Repeat in 27m at return. Reminded him to stay adequately hydrated.

## 2020-03-30 NOTE — Progress Notes (Signed)
Patient Name: William [name]  Date of Birth: Jun 24, 1965  MRN: [id number]  PCP: Eliezer Bottom, MD    Patient Active Problem List   Diagnosis Date Noted  . Human immunodeficiency virus (HIV) disease (HCC) 01/01/2008    Priority: High  . 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 Low body weight due to inadequate caloric intake 02/11/2015  . Preventative health care 10/22/2014  . Dyslipidemia 10/22/2014  . CIGARETTE SMOKER 06/02/2008    SUBJECTIVE: Brief Narrative:  William [name] is a 55 y.o. AA male with HIV, Dx in 2009.  HIV Risk: heterosexual contact.  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     Chief Complaint  Patient presents with  . Follow-up    B20      HPI: William [name] has no concerns today. Doing well and has not had any changes to his health since last visit. No concerns with his medications and taking it as directed once daily with food. Requesting flu vaccine today. Had last Pfizer shot in September - has not made a decision on booster yet.   Still working on quitting tobacco - hopes to quit on his birthday this year.    Review of Systems  Constitutional: Negative for chills and fever.  HENT: Negative for tinnitus.   Eyes: Negative for blurred vision and photophobia.  Respiratory: Negative for cough and sputum production.   Cardiovascular: Negative for chest pain.  Gastrointestinal: Negative for diarrhea, nausea and vomiting.  Genitourinary: Negative for dysuria.  Skin: Negative for rash.  Neurological: Negative for headaches.    Past Medical History:  Diagnosis Date  . Human  immunodeficiency virus (HIV) disease (HCC) 01/01/2008   Dx 2009  10/2016 - E92Q INSTI mutation (DTG sensitive) developed on Genvoya    . Hyperlipidemia 10/22/2014   Outpatient Medications Prior to Visit  Medication Sig Dispense Refill  . Multiple Vitamin (MULTIVITAMIN) tablet Take 1 tablet by mouth daily.    Marland Kitchen JULUCA 50-25 MG TABS TAKE 1 TABLET BY MOUTH DAILY. 30 tablet 0  . PREZCOBIX 800-150 MG tablet TAKE 1 TABLET BY MOUTH DAILY WITH BREAKFAST SWALLOW WHOLE DO NOT CRUSH, BREAK OR CHEW TABS 30 tablet 0   No facility-administered medications prior to visit.   No Known Allergies  OBJECTIVE:  Vitals:   03/30/20 0929  BP: 130/82  Pulse: 94  Temp: 97.6 F (36.4 C)  TempSrc: Oral  Weight: 151 lb (68.5 kg)   Body mass index is 21.06 kg/m.   Physical Exam HENT:     Mouth/Throat:     Mouth: No oral lesions.     Dentition: Normal dentition. No dental caries.  Eyes:     General: No scleral icterus. Cardiovascular:     Rate and Rhythm: Normal rate and regular rhythm.     Heart sounds: Normal heart sounds.  Pulmonary:     Effort: Pulmonary effort is normal.     Breath sounds: Normal breath sounds.  Abdominal:     General: There is no distension.     Palpations: Abdomen is soft.     Tenderness: There is no abdominal tenderness.  Lymphadenopathy:     Cervical: No cervical  adenopathy.  Skin:    General: Skin is warm and dry.     Findings: No rash.  Neurological:     Mental Status: He is alert and oriented to person, place, and time.  Psychiatric:        Mood and Affect: Mood and affect normal.     Lab Results Lab Results  Component Value Date   WBC 5.8 02/26/2020   HGB 15.0 02/26/2020   HCT 46.1 02/26/2020   MCV 78.1 (L) 02/26/2020   PLT 280 02/26/2020    Lab Results  Component Value Date   CREATININE 1.36 (H) 02/26/2020   BUN 15 02/26/2020   NA 137 02/26/2020   K 4.0 02/26/2020   CL 105 02/26/2020   CO2 22 02/26/2020    Lab Results  Component Value Date    ALT 9 07/09/2018   AST 16 07/09/2018   ALKPHOS 75 09/29/2014   BILITOT 0.3 07/09/2018    Lab Results  Component Value Date   CHOL 229 (H) 02/26/2020   HDL 53 02/26/2020   LDLCALC 153 (H) 02/26/2020   TRIG 115 02/26/2020   CHOLHDL 4.3 02/26/2020   HIV 1 RNA Quant  Date Value  02/26/2020 <20 Copies/mL  08/21/2019 <20 NOT DETECTED copies/mL  01/23/2019 <20 NOT DETECTED copies/mL   HIV-1 RNA Viral Load (copies/mL)  Date Value  10/17/2016 29,400   CD4 T Cell Abs (/uL)  Date Value  02/26/2020 316 (L)  08/21/2019 255 (L)  01/23/2019 180 (L)   Assessment & Plan:   Problem List Items Addressed This Visit      High   Human immunodeficiency virus (HIV) disease (HCC) (Chronic)    Reviewed labs with William [name] today. Continues to be undetectable with slow reconstitution of CD4 now 316 cells.   Vaccination counseling discussed at today's visit. Updated as outlined per recommendations for PLWH at today's visit. Flu shot today. COVID booster eligible in March.   Last colon cancer screening: [facility] Colonoscopy         Overdue - COLONOSCOPY (Pts 45-27yrs Insurance coverage will need to be confirmed) (Every 10 Years) Overdue - never done   No completion history exists for this topic.        *he will discuss colonoscopy with his PCP at next appt. Wants to get this done this year.   Depression screening discussed today - no needs at this time.  Patient is receiving dental care through [facility] dentist. No needs identified today.  STI screening offered today. Condoms provided.        Relevant Orders   HIV-1 RNA quant-no reflex-bld     Unprioritized   Fanconi-like syndrome (HCC)    Serum creatinine up 1.36 with normal electrolytes. Stable glycosuria and phosphorus. Repeat in 83m at return. Reminded him to stay adequately hydrated.       Relevant Orders   Renal Function Panel    Other Visit Diagnoses    Need for immunization against influenza    -  Primary   Relevant Orders   Flu  Vaccine QUAD 36+ mos IM (Completed)      William Alberts, MSN, NP-C [facility]  [email or web address] Pager: [phone] Office: [phone] [facility] Main Line: [phone]

## 2020-03-30 NOTE — Assessment & Plan Note (Signed)
Reviewed labs with [name] today. Continues to be undetectable with slow reconstitution of CD4 now 316 cells.   Vaccination counseling discussed at today's visit. Updated as outlined per recommendations for PLWH at today's visit. Flu shot today. COVID booster eligible in March.   Last colon cancer screening: [facility] Colonoscopy         Overdue - COLONOSCOPY (Pts 45-64yrs Insurance coverage will need to be confirmed) (Every 10 Years) Overdue - never done   No completion history exists for this topic.        *he will discuss colonoscopy with his PCP at next appt. Wants to get this done this year.   Depression screening discussed today - no needs at this time.  Patient is receiving dental care through [facility] dentist. No needs identified today.  STI screening offered today. Condoms provided.

## 2020-09-06 ENCOUNTER — Telehealth: Payer: Self-pay | Admitting: Internal Medicine

## 2020-09-06 NOTE — Telephone Encounter (Signed)
..   Medicaid Managed Care   Unsuccessful Outreach Note  09/06/2020 Name: William [name] MRN: [id number] DOB: 02/05/1966  Referred by: Eliezer Bottom, MD Reason for referral : High Risk Managed Medicaid (Attempted to reach Mr.Mcguinness today to get him scheduled for a phone visit with the [facility] Team. He did not answer and his VM is not set up.)   An unsuccessful telephone outreach was attempted today. The patient was referred to the case management team for assistance with care management and care coordination.   Follow Up Plan: The care management team will reach out to the patient again over the next 7-14 days.   Weston Settle Care Guide, High Risk Medicaid Managed Care Embedded Care Coordination [facility]  [facility]

## 2020-09-21 ENCOUNTER — Encounter: Payer: Self-pay | Admitting: *Deleted

## 2020-09-27 ENCOUNTER — Other Ambulatory Visit: Payer: Medicaid Other

## 2020-09-27 ENCOUNTER — Other Ambulatory Visit: Payer: Self-pay

## 2020-09-27 ENCOUNTER — Other Ambulatory Visit: Payer: Self-pay | Admitting: Infectious Diseases

## 2020-09-27 DIAGNOSIS — E7209 Other disorders of amino-acid transport: Secondary | ICD-10-CM

## 2020-09-27 DIAGNOSIS — B20 Human immunodeficiency virus [HIV] disease: Secondary | ICD-10-CM | POA: Diagnosis not present

## 2020-09-29 LAB — RENAL FUNCTION PANEL
Albumin: 4.1 g/dL (ref 3.6–5.1)
BUN: 12 mg/dL (ref 7–25)
CO2: 25 mmol/L (ref 20–32)
Calcium: 9.1 mg/dL (ref 8.6–10.3)
Chloride: 106 mmol/L (ref 98–110)
Creat: 1.13 mg/dL (ref 0.70–1.30)
Glucose, Bld: 121 mg/dL — ABNORMAL HIGH (ref 65–99)
Phosphorus: 3.4 mg/dL (ref 2.5–4.5)
Potassium: 4 mmol/L (ref 3.5–5.3)
Sodium: 139 mmol/L (ref 135–146)

## 2020-09-29 LAB — HIV-1 RNA QUANT-NO REFLEX-BLD
HIV 1 RNA Quant: NOT DETECTED Copies/mL
HIV-1 RNA Quant, Log: NOT DETECTED Log cps/mL

## 2020-10-08 ENCOUNTER — Other Ambulatory Visit: Payer: Self-pay

## 2020-10-08 ENCOUNTER — Other Ambulatory Visit: Payer: Self-pay | Admitting: *Deleted

## 2020-10-08 NOTE — Patient Instructions (Signed)
Thank you for taking time to speak with me today about care coordination and care management services available to you at no cost as part of your Medicaid benefit. These services are voluntary. Our team is available to provide assistance regarding your health care needs at any time. Please do not hesitate to reach out to me if we can be of service to you at any time in the future.   Estanislado Emms RN, BSN [place]  [facility] Care Coordinator [phone]

## 2020-10-08 NOTE — Patient Outreach (Signed)
Care Coordination  10/08/2020  Jarion Hawthorne Nov 06, 1965 [id number]  Deval Mroczka was referred to the [facility] Managed Care High Risk team for assistance with care coordination and care management services. Care coordination/care management services as part of the Medicaid benefit was offered to the patient today. The patient declined assistance offered today. Mr. Wahlen was thankful for the outreach, but denied having any needs at this time. He will reach out to his provider for a referral to the HR MM team if any needs arise.  Plan: The Medicaid Managed Care High Risk team is available at any time in the future to assist with care coordination/care management services upon referral.   Estanislado Emms RN, BSN [place]  [facility] RN Care Coordinator

## 2020-10-11 ENCOUNTER — Ambulatory Visit: Payer: Medicaid Other | Admitting: Infectious Diseases

## 2020-11-12 ENCOUNTER — Ambulatory Visit: Payer: Medicaid Other | Admitting: Infectious Diseases

## 2020-12-29 ENCOUNTER — Other Ambulatory Visit: Payer: Self-pay | Admitting: Infectious Diseases

## 2020-12-29 DIAGNOSIS — B20 Human immunodeficiency virus [HIV] disease: Secondary | ICD-10-CM

## 2021-02-28 ENCOUNTER — Other Ambulatory Visit: Payer: Self-pay | Admitting: Infectious Diseases

## 2021-02-28 DIAGNOSIS — B20 Human immunodeficiency virus [HIV] disease: Secondary | ICD-10-CM

## 2021-03-01 ENCOUNTER — Telehealth: Payer: Self-pay

## 2021-03-01 NOTE — Telephone Encounter (Signed)
Called patient to schedule overdue follow up appointment with office. Pt was able to schedule visit on 12/19. States he has not missed any does of medication. Will do labs same day per pt request. Juanita Laster, RMA

## 2021-03-01 NOTE — Telephone Encounter (Signed)
Has appt 12/19

## 2021-03-07 ENCOUNTER — Encounter: Payer: Self-pay | Admitting: Infectious Diseases

## 2021-03-07 ENCOUNTER — Other Ambulatory Visit: Payer: Self-pay

## 2021-03-07 ENCOUNTER — Ambulatory Visit (INDEPENDENT_AMBULATORY_CARE_PROVIDER_SITE_OTHER): Payer: Medicaid Other

## 2021-03-07 ENCOUNTER — Ambulatory Visit (INDEPENDENT_AMBULATORY_CARE_PROVIDER_SITE_OTHER): Payer: Medicaid Other | Admitting: Infectious Diseases

## 2021-03-07 VITALS — BP 136/84 | HR 88 | Temp 98.2°F | Wt 155.0 lb

## 2021-03-07 DIAGNOSIS — B20 Human immunodeficiency virus [HIV] disease: Secondary | ICD-10-CM | POA: Diagnosis not present

## 2021-03-07 DIAGNOSIS — E7209 Other disorders of amino-acid transport: Secondary | ICD-10-CM | POA: Diagnosis not present

## 2021-03-07 DIAGNOSIS — Z23 Encounter for immunization: Secondary | ICD-10-CM

## 2021-03-07 DIAGNOSIS — Z79899 Other long term (current) drug therapy: Secondary | ICD-10-CM

## 2021-03-07 DIAGNOSIS — Z113 Encounter for screening for infections with a predominantly sexual mode of transmission: Secondary | ICD-10-CM

## 2021-03-07 MED ORDER — JULUCA 50-25 MG PO TABS: 1.0000 | ORAL_TABLET | Freq: Every day | ORAL | 11 refills | Status: DC

## 2021-03-07 MED ORDER — PREZCOBIX 800-150 MG PO TABS: ORAL_TABLET | ORAL | 11 refills | Status: DC

## 2021-03-07 NOTE — Assessment & Plan Note (Signed)
He has had several years now with normal renal panels now. Still with ongoing Glucosuria but no evidence of progression of filtration abnormalities.  He can come in yearly with ongoing monitoring.

## 2021-03-07 NOTE — Assessment & Plan Note (Addendum)
William [name] has been doing very good on Juluca and Prezcobix once daily. His viral load is undetectable. His CD4 is > 300 and he has been feeling much better and gained some weight back.  Not currently sexually active.  Flu and COVID bivalent booster given today.  He has PCP and working on colon cancer screening with [facility].   He can return in 1 year with labs prior to her visit.

## 2021-03-07 NOTE — Progress Notes (Signed)
Patient Name: William [name]  Date of Birth: 1965-07-23  MRN: [id number]  PCP: Eliezer Bottom, MD     SUBJECTIVE: Brief Narrative:  William [name] is a 55 y.o. AA male with HIV, Dx in 2009.  HIV Risk: heterosexual contact.  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     Chief Complaint  Patient presents with   Follow-up    B20       HPI: William [name] is here for 51m FU on well controlled HIV on once daily regimen of Juluca + Prezcobix taken together with food. No missed doses or concern over side effects to his medication. No new chronic health conditions / new medications.  CD4 count 02-2020 361.  Creatinine recently 1.13. Normal electrolytes. Normal urinalysis.     Review of Systems  Constitutional:  Negative for chills and fever.  HENT:  Negative for tinnitus.   Eyes:  Negative for blurred vision and photophobia.  Respiratory:  Negative for cough and sputum production.   Cardiovascular:  Negative for chest pain.  Gastrointestinal:  Negative for diarrhea, nausea and vomiting.  Genitourinary:  Negative for dysuria.  Skin:  Negative for rash.  Neurological:  Negative for headaches.   Past Medical History:  Diagnosis Date   Human immunodeficiency virus (HIV) disease (HCC) 01/01/2008   Dx 2009  10/2016 - E92Q INSTI mutation (DTG sensitive) developed on Genvoya     Hyperlipidemia 10/22/2014   Outpatient Medications Prior to Visit  Medication Sig Dispense Refill   Multiple Vitamin (MULTIVITAMIN) tablet Take 1 tablet by mouth daily.     JULUCA 50-25 MG tablet TAKE 1 TABLET BY MOUTH DAILY. 30 tablet 1   PREZCOBIX 800-150 MG tablet TAKE 1 TABLET BY MOUTH DAILY WITH  BREAKFAST. SWALLOW WHOLE. DO NOT CRUSH, BREAK OR CHEW TABS. 30 tablet 1   No facility-administered medications prior to visit.   No Known Allergies  OBJECTIVE:  Vitals:   03/07/21 0845  BP: 136/84  Pulse: 88  Temp: 98.2 F (36.8 C)  TempSrc: Temporal  SpO2: 97%  Weight: 155 lb (70.3 kg)   Body mass index is 21.62 kg/m.   Physical Exam HENT:     Mouth/Throat:     Mouth: No oral lesions.     Dentition: Normal dentition. No dental caries.  Eyes:     General: No scleral icterus. Cardiovascular:     Rate and Rhythm: Normal rate and regular rhythm.     Heart sounds: Normal heart sounds.  Pulmonary:     Effort: Pulmonary effort is normal.     Breath sounds: Normal breath sounds.  Abdominal:     General: There is no distension.     Palpations: Abdomen is soft.     Tenderness: There is no abdominal tenderness.  Lymphadenopathy:     Cervical: No cervical adenopathy.  Skin:    General: Skin is warm and dry.     Findings: No rash.  Neurological:     Mental Status: He is alert and oriented to person, place, and time.  Psychiatric:        Mood and Affect: Mood and affect normal.    Lab Results Lab Results  Component Value Date   WBC  5.8 02/26/2020   HGB 15.0 02/26/2020   HCT 46.1 02/26/2020   MCV 78.1 (L) 02/26/2020   PLT 280 02/26/2020    Lab Results  Component Value Date   CREATININE 1.13 09/27/2020   BUN 12 09/27/2020   NA 139 09/27/2020   K 4.0 09/27/2020   CL 106 09/27/2020   CO2 25 09/27/2020    Lab Results  Component Value Date   ALT 9 07/09/2018   AST 16 07/09/2018   ALKPHOS 75 09/29/2014   BILITOT 0.3 07/09/2018    Lab Results  Component Value Date   CHOL 229 (H) 02/26/2020   HDL 53 02/26/2020   LDLCALC 153 (H) 02/26/2020   TRIG 115 02/26/2020   CHOLHDL 4.3 02/26/2020   HIV 1 RNA Quant  Date Value  09/27/2020 Not Detected Copies/mL  02/26/2020 <20 Copies/mL  08/21/2019 <20 NOT DETECTED copies/mL   HIV-1 RNA Viral Load (copies/mL)   Date Value  10/17/2016 29,400   CD4 T Cell Abs (/uL)  Date Value  02/26/2020 316 (L)  08/21/2019 255 (L)  01/23/2019 180 (L)   Assessment & Plan:   Problem List Items Addressed This Visit       High   Human immunodeficiency virus (HIV) disease (HCC) (Chronic)    William [name] has been doing very good on [place] and Prezcobix once daily. His viral load is undetectable. His CD4 is > 300 and he has been feeling much better and gained some weight back.  Not currently sexually active.  Flu and COVID bivalent booster given today.  He has PCP and working on colon cancer screening with [facility].   He can return in 1 year with labs prior to her visit.       Relevant Medications   darunavir-cobicistat (PREZCOBIX) 800-150 MG tablet   dolutegravir-rilpivirine (JULUCA) 50-25 MG tablet   Other Relevant Orders   CBC   T-helper cell (CD4)- ([facility] clinic only)   HIV-1 RNA quant-no reflex-bld   Hepatic function panel     Unprioritized   Fanconi-like syndrome (HCC) - Primary    He has had several years now with normal renal panels now. Still with ongoing Glucosuria but no evidence of progression of filtration abnormalities.  He can come in yearly with ongoing monitoring.      Relevant Orders   Renal Function Panel   Urinalysis   Other Visit Diagnoses     Need for immunization against influenza       Routine screening for STI (sexually transmitted infection)       Relevant Orders   RPR   Urine cytology ancillary only   High risk medication use       Relevant Orders   Lipid panel      [name], MSN, NP-C [facility]  [email or web address] Pager: [phone] Office: [phone] [facility] Main Line: [phone]

## 2021-03-07 NOTE — Progress Notes (Signed)
° °  Covid-19 Vaccination Clinic  Name:  Daeton Kluth    MRN: [id number] DOB: [birth year]  03/07/2021  Mr. Stager was observed post Covid-19 immunization for 15 minutes without incident. He was provided with Vaccine Information Sheet and instruction to access the V-Safe system.   Mr. Remo was instructed to call 911 with any severe reactions post vaccine: Difficulty breathing  Swelling of face and throat  A fast heartbeat  A bad rash all over body  Dizziness and weakness   Immunizations Administered     Name Date Dose VIS Date Route   Pfizer Covid-19 Vaccine Bivalent Booster 03/07/2021  9:50 AM 0.3 mL 11/17/2020 Intramuscular   Manufacturer: [employer], [employer]   Lot: [id number]   NDC: [phone]        Brek Reece Lesli Albee, CMA

## 2021-03-07 NOTE — Patient Instructions (Signed)
Nice to see you   No changes - please continue your Juluca and Prezcobix once a day as you have been doing. You have plenty of refills  Your labs look great.   Please schedule a follow up in 1 year with labs 2 weeks before for blood and urine testing.   Happy Holidays!

## 2021-07-28 DIAGNOSIS — H25813 Combined forms of age-related cataract, bilateral: Secondary | ICD-10-CM | POA: Diagnosis not present

## 2021-07-28 DIAGNOSIS — H10811 Pingueculitis, right eye: Secondary | ICD-10-CM | POA: Diagnosis not present

## 2021-07-28 DIAGNOSIS — H01023 Squamous blepharitis right eye, unspecified eyelid: Secondary | ICD-10-CM | POA: Diagnosis not present

## 2021-07-28 DIAGNOSIS — H01026 Squamous blepharitis left eye, unspecified eyelid: Secondary | ICD-10-CM | POA: Diagnosis not present

## 2021-08-22 DIAGNOSIS — H01026 Squamous blepharitis left eye, unspecified eyelid: Secondary | ICD-10-CM | POA: Diagnosis not present

## 2021-08-22 DIAGNOSIS — H01023 Squamous blepharitis right eye, unspecified eyelid: Secondary | ICD-10-CM | POA: Diagnosis not present

## 2021-08-22 DIAGNOSIS — H10811 Pingueculitis, right eye: Secondary | ICD-10-CM | POA: Diagnosis not present

## 2022-02-21 ENCOUNTER — Other Ambulatory Visit: Payer: Medicaid Other

## 2022-02-21 ENCOUNTER — Other Ambulatory Visit (HOSPITAL_COMMUNITY)
Admission: RE | Admit: 2022-02-21 | Discharge: 2022-02-21 | Disposition: A | Discharge status: Home / Self Care | Payer: Medicaid Other | Source: Ambulatory Visit | Attending: Infectious Diseases | Admitting: Infectious Diseases

## 2022-02-21 ENCOUNTER — Other Ambulatory Visit: Payer: Self-pay

## 2022-02-21 DIAGNOSIS — B20 Human immunodeficiency virus [HIV] disease: Secondary | ICD-10-CM

## 2022-02-21 DIAGNOSIS — Z113 Encounter for screening for infections with a predominantly sexual mode of transmission: Secondary | ICD-10-CM | POA: Insufficient documentation

## 2022-02-21 DIAGNOSIS — E7209 Other disorders of amino-acid transport: Secondary | ICD-10-CM

## 2022-02-21 DIAGNOSIS — Z79899 Other long term (current) drug therapy: Secondary | ICD-10-CM

## 2022-02-21 LAB — URINALYSIS
Bilirubin Urine: NEGATIVE
Hgb urine dipstick: NEGATIVE
Ketones, ur: NEGATIVE
Leukocytes,Ua: NEGATIVE
Nitrite: NEGATIVE
Protein, ur: NEGATIVE
Specific Gravity, Urine: 1.039 — ABNORMAL HIGH (ref 1.001–1.035)
pH: <=5 (ref 5.0–8.0)

## 2022-02-22 LAB — URINE CYTOLOGY ANCILLARY ONLY
Chlamydia: NEGATIVE
Comment: NEGATIVE
Comment: NEGATIVE
Comment: NORMAL
Neisseria Gonorrhea: NEGATIVE
Trichomonas: NEGATIVE

## 2022-02-22 LAB — T-HELPER CELL (CD4) - (RCID CLINIC ONLY)
CD4 % Helper T Cell: 15 % — ABNORMAL LOW (ref 33–65)
CD4 T Cell Abs: 412 /uL (ref 400–1790)

## 2022-02-23 LAB — RENAL FUNCTION PANEL
Albumin: 4.1 g/dL (ref 3.6–5.1)
BUN: 13 mg/dL (ref 7–25)
CO2: 23 mmol/L (ref 20–32)
Calcium: 9.1 mg/dL (ref 8.6–10.3)
Chloride: 106 mmol/L (ref 98–110)
Creat: 1.21 mg/dL (ref 0.70–1.30)
Glucose, Bld: 123 mg/dL — ABNORMAL HIGH (ref 65–99)
Phosphorus: 3.7 mg/dL (ref 2.5–4.5)
Potassium: 3.9 mmol/L (ref 3.5–5.3)
Sodium: 139 mmol/L (ref 135–146)

## 2022-02-23 LAB — HEPATIC FUNCTION PANEL
AG Ratio: 1.5 (calc) (ref 1.0–2.5)
ALT: 10 U/L (ref 9–46)
AST: 15 U/L (ref 10–35)
Albumin: 4.1 g/dL (ref 3.6–5.1)
Alkaline phosphatase (APISO): 69 U/L (ref 35–144)
Bilirubin, Direct: 0.1 mg/dL (ref 0.0–0.2)
Globulin: 2.8 g/dL (calc) (ref 1.9–3.7)
Indirect Bilirubin: 0.2 mg/dL (calc) (ref 0.2–1.2)
Total Bilirubin: 0.3 mg/dL (ref 0.2–1.2)
Total Protein: 6.9 g/dL (ref 6.1–8.1)

## 2022-02-23 LAB — CBC
HCT: 45 % (ref 38.5–50.0)
Hemoglobin: 14.5 g/dL (ref 13.2–17.1)
MCH: 25.3 pg — ABNORMAL LOW (ref 27.0–33.0)
MCHC: 32.2 g/dL (ref 32.0–36.0)
MCV: 78.7 fL — ABNORMAL LOW (ref 80.0–100.0)
MPV: 10.7 fL (ref 7.5–12.5)
Platelets: 262 10*3/uL (ref 140–400)
RBC: 5.72 10*6/uL (ref 4.20–5.80)
RDW: 14 % (ref 11.0–15.0)
WBC: 6.4 10*3/uL (ref 3.8–10.8)

## 2022-02-23 LAB — HIV-1 RNA QUANT-NO REFLEX-BLD
HIV 1 RNA Quant: <20 Copies/mL — ABNORMAL HIGH
HIV-1 RNA Quant, Log: <1.3 Log cps/mL — ABNORMAL HIGH

## 2022-02-23 LAB — LIPID PANEL
Cholesterol: 209 mg/dL — ABNORMAL HIGH (ref <200)
HDL: 55 mg/dL (ref >=40)
LDL Cholesterol (Calc): 126 mg/dL (calc) — ABNORMAL HIGH
Non-HDL Cholesterol (Calc): 154 mg/dL (calc) — ABNORMAL HIGH (ref <130)
Total CHOL/HDL Ratio: 3.8 (calc) (ref <5.0)
Triglycerides: 165 mg/dL — ABNORMAL HIGH (ref <150)

## 2022-02-23 LAB — RPR: RPR Ser Ql: NONREACTIVE

## 2022-03-07 ENCOUNTER — Encounter: Payer: Self-pay | Admitting: Infectious Diseases

## 2022-03-07 ENCOUNTER — Ambulatory Visit: Payer: Medicaid Other

## 2022-03-07 ENCOUNTER — Other Ambulatory Visit: Payer: Self-pay

## 2022-03-07 ENCOUNTER — Ambulatory Visit (INDEPENDENT_AMBULATORY_CARE_PROVIDER_SITE_OTHER): Payer: Medicaid Other | Admitting: Infectious Diseases

## 2022-03-07 VITALS — BP 128/85 | HR 67 | Temp 98.0°F | Resp 18 | Ht 71.0 in | Wt 147.3 lb

## 2022-03-07 DIAGNOSIS — Z5181 Encounter for therapeutic drug level monitoring: Secondary | ICD-10-CM

## 2022-03-07 DIAGNOSIS — F17210 Nicotine dependence, cigarettes, uncomplicated: Secondary | ICD-10-CM | POA: Diagnosis not present

## 2022-03-07 DIAGNOSIS — E7209 Other disorders of amino-acid transport: Secondary | ICD-10-CM | POA: Diagnosis not present

## 2022-03-07 DIAGNOSIS — Z0000 Encounter for general adult medical examination without abnormal findings: Secondary | ICD-10-CM

## 2022-03-07 DIAGNOSIS — Z23 Encounter for immunization: Secondary | ICD-10-CM | POA: Diagnosis not present

## 2022-03-07 DIAGNOSIS — B20 Human immunodeficiency virus [HIV] disease: Secondary | ICD-10-CM

## 2022-03-07 DIAGNOSIS — F17200 Nicotine dependence, unspecified, uncomplicated: Secondary | ICD-10-CM

## 2022-03-07 MED ORDER — ZOSTER VAC RECOMB ADJUVANTED 50 MCG/0.5ML IM SUSR: 0.5000 mL | INTRAMUSCULAR | 1 refills | Status: DC

## 2022-03-07 MED ORDER — PITAVASTATIN CALCIUM 4 MG PO TABS: 1.0000 | ORAL_TABLET | Freq: Every day | ORAL | 11 refills | Status: DC

## 2022-03-07 MED ORDER — JULUCA 50-25 MG PO TABS: 1.0000 | ORAL_TABLET | Freq: Every day | ORAL | 11 refills | Status: DC

## 2022-03-07 MED ORDER — PREZCOBIX 800-150 MG PO TABS: ORAL_TABLET | ORAL | 11 refills | Status: DC

## 2022-03-07 NOTE — Patient Instructions (Addendum)
Will send in a cholesterol medicine that I believe will decrease your risk of heart disease called pitavastatin. Please take this one tablet once a day with your other medicines.   Please schedule  a follow up lab test in 2 months to make sure your liver is doing well adding this medicine.   Would recommend the Shingrix (shingles) vaccine. Two doses for you to help protect against shingles rash. I will send you with a prescription to take to your pharmacy - would wait 2 weeks after these vaccines to get your first dose.   Would also recommend talking with your primary care team about a lung cancer screening scan to make sure nothing concerning with your years of smoking history.    Please continue the prezcobix + juluca together with food everyday.   Very pleased with how well you are doing!   Cheers to 2024!

## 2022-03-07 NOTE — Assessment & Plan Note (Signed)
Recommend to discuss with his primary care team utility of lung cancer screening study. He will plan to discuss with them.

## 2022-03-07 NOTE — Assessment & Plan Note (Signed)
Very well controlled on once daily Juluca + Prezcobix taken together daily with food. No concerns with access or adherence to medication. They are tolerating the medication well without side effects. No drug interactions identified. Pertinent lab tests reviewed that were collected recently - his VL is undetectable and his CD4 has recovered to > 400 now.  No changes to insurance coverage.  No dental needs today.  No concern over anxious/depressed mood.  Sexual health and family planning discussed - no needs today.  Vaccines updated today - see health maintenance section.    With new research coming out by way of the REPRIEVE study regarding cardiovascular event risk reduction for PLWH, I discussed that over the 8 year trial initiation of statin (pitavastatin) therapy was shown to reduce CV disease by 35% independent of other risk factors.   The 10-year ASCVD risk score (Arnett DK, et al., 2019) is: 11.6%   Values used to calculate the score:     Age: 56 years     Sex: Male     Is Non-Hispanic African American: Yes     Diabetic: No     Tobacco smoker: Yes     Systolic Blood Pressure: 128 mmHg     Is BP treated: No     HDL Cholesterol: 55 mg/dL     Total Cholesterol: 209 mg/dL   We discussed the patient's 10-year CVD Risk Score to be at least moderately elevated, and would benefit from intervention given HIV+ and > 25 yo.  Will send in Rx for pitavastatin 4 mg tablets - this would be ideal statin due to drug interactions with his HIV medicine and least risky regarding side effects for him.    Return in about 1 year (around 03/08/2023).

## 2022-03-07 NOTE — Assessment & Plan Note (Signed)
Stable with glycosuria but normal renal panel. Will continue to monitor annually with urinalysis and renal panel. He is off tenofovir now.

## 2022-03-07 NOTE — Assessment & Plan Note (Signed)
Prevnar 20 to complete pneumococcal vaccine series Aged out HPV and Menveo Flu vaccine today COVID booster today  Rx given for shingrix vaccine - I asked him to make sure he waits > 2 weeks after the vaccines he received today  Screening lung CT discussion recommended w/ smoking history Had colon cancer cologuard screening (-)  Statin for heart disease risk reduction discussed.

## 2022-03-07 NOTE — Progress Notes (Signed)
   Covid-19 Vaccination Clinic  Name:  Spenser Harren    MRN: [id number] DOB: January 03, 1966  03/07/2022  Mr. Klumpp was observed post Covid-19 immunization for 15 minutes without incident. He was provided with Vaccine Information Sheet and instruction to access the V-Safe system.   Mr. Morissette was instructed to call 911 with any severe reactions post vaccine: Difficulty breathing  Swelling of face and throat  A fast heartbeat  A bad rash all over body  Dizziness and weakness

## 2022-03-07 NOTE — Progress Notes (Signed)
Patient Name: William [name]  Date of Birth: 03/18/1966  MRN: [id number]  PCP: Morene Crocker, MD     SUBJECTIVE: Brief Narrative:  William [name] is a 56 y.o. AA male with HIV, Dx in 2009.  HIV Risk: heterosexual contact.  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      Chief Complaint  Patient presents with   Follow-up      HPI: Marquee is here for 56m FU on well controlled HIV on once daily regimen of Juluca + Prezcobix taken together with food. No missed doses or concern over side effects to his medication. No new chronic health conditions / new medications.  CD4 count 02-2020 361.  Creatinine recently 1.13. Normal electrolytes. Normal urinalysis.   Interested in any recommended vaccines.  Still smoking some cigarettes, onset since 21. Now ~5 or lest cigarettes a day.     Review of Systems  Constitutional:  Negative for chills and fever.  HENT:  Negative for tinnitus.   Eyes:  Negative for blurred vision and photophobia.  Respiratory:  Negative for cough and sputum production.   Cardiovascular:  Negative for chest pain.  Gastrointestinal:  Negative for diarrhea, nausea and vomiting.  Genitourinary:  Negative for dysuria.  Skin:  Negative for rash.  Neurological:  Negative for headaches.    Past Medical History:  Diagnosis Date   Human immunodeficiency virus (HIV) disease (HCC) 01/01/2008   Dx 2009  10/2016 - E92Q INSTI mutation (DTG sensitive) developed on Genvoya     Hyperlipidemia 10/22/2014   Outpatient Medications Prior to Visit  Medication Sig Dispense Refill   Multiple Vitamin (MULTIVITAMIN) tablet Take 1 tablet by mouth daily.      darunavir-cobicistat (PREZCOBIX) 800-150 MG tablet Swallow whole. Do NOT crush, break or chew tablets. Take with food. 30 tablet 11   dolutegravir-rilpivirine (JULUCA) 50-25 MG tablet Take 1 tablet by mouth daily. 30 tablet 11   No facility-administered medications prior to visit.   No Known Allergies  OBJECTIVE:  Vitals:   03/07/22 0904  BP: 128/85  Pulse: 67  Resp: 18  Temp: 98 F (36.7 C)  Weight: 147 lb 4.8 oz (66.8 kg)  Height: 5\' 11"  (1.803 m)   Body mass index is 20.54 kg/m.   Physical Exam HENT:     Mouth/Throat:     Mouth: No oral lesions.     Dentition: Normal dentition. No dental caries.  Eyes:     General: No scleral icterus. Cardiovascular:     Rate and Rhythm: Normal rate and regular rhythm.     Heart sounds: Normal heart sounds.  Pulmonary:     Effort: Pulmonary effort is normal.     Breath sounds: Normal breath sounds.  Abdominal:     General: There is no distension.     Palpations: Abdomen is soft.     Tenderness: There is no abdominal tenderness.  Lymphadenopathy:     Cervical: No cervical adenopathy.  Skin:    General: Skin is warm and dry.     Findings: No rash.  Neurological:     Mental Status: He is alert and oriented to person, place, and time.  Psychiatric:  Mood and Affect: Mood and affect normal.     Lab Results Lab Results  Component Value Date   WBC 6.4 02/21/2022   HGB 14.5 02/21/2022   HCT 45.0 02/21/2022   MCV 78.7 (L) 02/21/2022   PLT 262 02/21/2022    Lab Results  Component Value Date   CREATININE 1.21 02/21/2022   BUN 13 02/21/2022   NA 139 02/21/2022   K 3.9 02/21/2022   CL 106 02/21/2022   CO2 23 02/21/2022    Lab Results  Component Value Date   ALT 10 02/21/2022   AST 15 02/21/2022   ALKPHOS 75 09/29/2014   BILITOT 0.3 02/21/2022    Lab Results  Component Value Date   CHOL 209 (H) 02/21/2022   HDL 55 02/21/2022   LDLCALC 126 (H) 02/21/2022   TRIG 165 (H) 02/21/2022   CHOLHDL 3.8 02/21/2022    HIV 1 RNA Quant (Copies/mL)  Date Value  02/21/2022 <20 (H)  09/27/2020 Not Detected  02/26/2020 <20   HIV-1 RNA Viral Load (copies/mL)  Date Value  10/17/2016 29,400   CD4 T Cell Abs (/uL)  Date Value  02/21/2022 412  02/26/2020 316 (L)  08/21/2019 255 (L)   Assessment & Plan:   Problem List Items Addressed This Visit       High   Human immunodeficiency virus (HIV) disease (HCC) - Primary (Chronic)    Very well controlled on once daily Juluca + Prezcobix taken together daily with food. No concerns with access or adherence to medication. They are tolerating the medication well without side effects. No drug interactions identified. Pertinent lab tests reviewed that were collected recently - his VL is undetectable and his CD4 has recovered to > 400 now.  No changes to insurance coverage.  No dental needs today.  No concern over anxious/depressed mood.  Sexual health and family planning discussed - no needs today.  Vaccines updated today - see health maintenance section.    With new research coming out by way of the REPRIEVE study regarding cardiovascular event risk reduction for PLWH, I discussed that over the 8 year trial initiation of statin (pitavastatin) therapy was shown to reduce CV disease by 35% independent of other risk factors.   The 10-year ASCVD risk score (Arnett DK, et al., 2019) is: 11.6%   Values used to calculate the score:     Age: 23 years     Sex: Male     Is Non-Hispanic African American: Yes     Diabetic: No     Tobacco smoker: Yes     Systolic Blood Pressure: 128 mmHg     Is BP treated: No     HDL Cholesterol: 55 mg/dL     Total Cholesterol: 209 mg/dL   We discussed the patient's 10-year CVD Risk Score to be at least moderately elevated, and would benefit from intervention given HIV+ and > 56 yo.  Will send in Rx for pitavastatin 4 mg tablets - this would be ideal statin due to drug interactions with his HIV medicine and least risky regarding side  effects for him.    Return in about 1 year (around 03/08/2023).        Relevant Medications   Zoster Vaccine Adjuvanted (SHINGRIX) injection   darunavir-cobicistat (PREZCOBIX) 800-150 MG tablet   dolutegravir-rilpivirine (JULUCA) 50-25 MG tablet   Pitavastatin Calcium 4 MG TABS   Other Relevant Orders   COMPLETE METABOLIC PANEL WITH GFR   CBC   Hemoglobin A1c   HIV  1 RNA quant-no reflex-bld   T-helper cells (CD4) count   Lipid panel     Unprioritized   CIGARETTE SMOKER (Chronic)    Recommend to discuss with his primary care team utility of lung cancer screening study. He will plan to discuss with them.       Fanconi-like syndrome (HCC)    Stable with glycosuria but normal renal panel. Will continue to monitor annually with urinalysis and renal panel. He is off tenofovir now.       Relevant Orders   Urinalysis   Preventative health care    Prevnar 20 to complete pneumococcal vaccine series Aged out HPV and Menveo Flu vaccine today COVID booster today  Rx given for shingrix vaccine - I asked him to make sure he waits > 2 weeks after the vaccines he received today  Screening lung CT discussion recommended w/ smoking history Had colon cancer cologuard screening (-)  Statin for heart disease risk reduction discussed.       Other Visit Diagnoses     Need for immunization against influenza       Need for pneumococcal vaccination       Relevant Orders   Pneumococcal conjugate vaccine 20-valent   Need for COVID-19 vaccine       Medication monitoring encounter       Relevant Orders   Hepatic function panel       [name], MSN, NP-C [facility]  [email or web address] Pager: [phone] Office: [phone] [facility] Main Line: [phone]

## 2022-03-23 ENCOUNTER — Telehealth: Payer: Self-pay

## 2022-03-23 NOTE — Telephone Encounter (Signed)
Faxed drug request form to [facility] for pitavastatin approval. Awaiting response.   Beryle Flock, RN

## 2022-05-01 ENCOUNTER — Telehealth: Payer: Self-pay

## 2022-05-01 ENCOUNTER — Ambulatory Visit: Payer: Medicaid Other | Admitting: Infectious Diseases

## 2022-05-01 NOTE — Telephone Encounter (Signed)
I am confused. What medicine did I refuse?   Last time we started him on a cholesterol medicine.  Looks like I gave him 1 year of fills. So they should be able to send it to him.  Maybe something switched with his insurance.   I would like for him to continue the pitavastatin daily and he can see me again in December 2024 for follow up.  Thank you!

## 2022-05-01 NOTE — Telephone Encounter (Signed)
Spoke to patient regarding no show on 05/01/22 and he stated the pharmacy told him that [facility] [place] refused to fill the third RX that they discussed adding so he did not think she needed to see him until his one year follow up. Advised I will send message to provider to verify medication status and when he needs to follow up.

## 2023-02-20 ENCOUNTER — Other Ambulatory Visit: Payer: Self-pay | Admitting: Infectious Diseases

## 2023-02-20 DIAGNOSIS — B20 Human immunodeficiency virus [HIV] disease: Secondary | ICD-10-CM

## 2023-02-20 NOTE — Telephone Encounter (Signed)
Pt due for appt 

## 2023-02-27 ENCOUNTER — Other Ambulatory Visit: Payer: Self-pay

## 2023-02-27 ENCOUNTER — Encounter: Payer: Self-pay | Admitting: Infectious Diseases

## 2023-02-27 ENCOUNTER — Ambulatory Visit: Payer: Medicaid Other | Admitting: Infectious Diseases

## 2023-02-27 VITALS — BP 108/74 | HR 87 | Temp 98.1°F | Resp 16 | Wt 144.0 lb

## 2023-02-27 DIAGNOSIS — Z113 Encounter for screening for infections with a predominantly sexual mode of transmission: Secondary | ICD-10-CM | POA: Diagnosis not present

## 2023-02-27 DIAGNOSIS — Z23 Encounter for immunization: Secondary | ICD-10-CM | POA: Diagnosis not present

## 2023-02-27 DIAGNOSIS — B20 Human immunodeficiency virus [HIV] disease: Secondary | ICD-10-CM | POA: Diagnosis not present

## 2023-02-27 DIAGNOSIS — E7209 Other disorders of amino-acid transport: Secondary | ICD-10-CM

## 2023-02-27 MED ORDER — PREZCOBIX 800-150 MG PO TABS: ORAL_TABLET | ORAL | 11 refills | Status: DC

## 2023-02-27 MED ORDER — JULUCA 50-25 MG PO TABS: 1.0000 | ORAL_TABLET | Freq: Every day | ORAL | 11 refills | Status: DC

## 2023-02-27 MED ORDER — PITAVASTATIN CALCIUM 4 MG PO TABS: 1.0000 | ORAL_TABLET | Freq: Every day | ORAL | 11 refills | Status: DC

## 2023-02-27 NOTE — Progress Notes (Signed)
Patient Name: William [name]  Date of Birth: 1965/11/03  MRN: [id number]  PCP: Morene Crocker, MD     SUBJECTIVE: Brief Narrative:  William [name] is a 57 y.o. AA male with HIV, Dx in 2009.  HIV Risk: heterosexual contact.  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      Chief Complaint  Patient presents with   Follow-up      Discussed the use of AI scribe software for clinical note transcription with the patient, who gave verbal consent to proceed.  History of Present Illness   William [name] is here today for routine HIV care and follow up, has been managing well with his current regimen of two antiretroviral medications (Juluca + Prezcobix) taken together once daily with food. He reports no issues with obtaining or taking his medications. However, there seems to be a delay in the shipment of one of his medications - he acknowledges not to take one without the other.  In addition to his HIV management, the patient is also on a cholesterol medication, which has been proven to reduce the risk of heart disease, especially in people with HIV and those who smoke. The patient admits to still smoking, albeit less than before, and is working on quitting.  The patient has been keeping busy with part-time work, which he enjoys and finds fulfilling. He has no holiday plans and is looking forward to the end of the year. No other health concerns or issues were brought up during the consultation.       Review of Systems  Constitutional:  Negative for chills and fever.  HENT:  Negative for tinnitus.   Eyes:  Negative for blurred vision and photophobia.  Respiratory:  Negative for  cough and sputum production.   Cardiovascular:  Negative for chest pain.  Gastrointestinal:  Negative for diarrhea, nausea and vomiting.  Genitourinary:  Negative for dysuria.  Skin:  Negative for rash.  Neurological:  Negative for headaches.    Past Medical History:  Diagnosis Date   Human immunodeficiency virus (HIV) disease (HCC) 01/01/2008   Dx 2009  10/2016 - E92Q INSTI mutation (DTG sensitive) developed on Genvoya     Hyperlipidemia 10/22/2014   Outpatient Medications Prior to Visit  Medication Sig Dispense Refill   Multiple Vitamin (MULTIVITAMIN) tablet Take 1 tablet by mouth daily.     darunavir-cobicistat (PREZCOBIX) 800-150 MG tablet TAKE 1 TABLET BY MOUTH EVERY DAY. SWALLOW WHOLE. DO NOT CRUSH, BREAK OR CHEW. TAKE WITH FOOD.. 30 tablet 0   JULUCA 50-25 MG tablet TAKE 1 TABLET BY MOUTH EVERY DAY 30 tablet 0   Pitavastatin Calcium 4 MG TABS Take 1 tablet (4 mg total) by mouth daily. (Patient not taking: Reported on 02/27/2023) 30 tablet 11   Zoster Vaccine Adjuvanted [facility]) injection Inject 0.5 mLs into the muscle every 8 (eight) weeks. (Patient not taking: Reported on 02/27/2023) 0.5 mL 1   No facility-administered medications prior to visit.   No Known Allergies  OBJECTIVE:  Vitals:   02/27/23 1024  BP: 108/74  Pulse: 87  Resp: 16  Temp: 98.1 F (36.7 C)  TempSrc: Oral  SpO2: 97%  Weight: 144 lb (65.3 kg)   Body mass index  is 20.08 kg/m.   Physical Exam HENT:     Mouth/Throat:     Mouth: No oral lesions.     Dentition: Normal dentition. No dental caries.  Eyes:     General: No scleral icterus. Cardiovascular:     Rate and Rhythm: Normal rate and regular rhythm.     Heart sounds: Normal heart sounds.  Pulmonary:     Effort: Pulmonary effort is normal.     Breath sounds: Normal breath sounds.  Abdominal:     General: There is no distension.     Palpations: Abdomen is soft.     Tenderness: There is no abdominal tenderness.  Lymphadenopathy:      Cervical: No cervical adenopathy.  Skin:    General: Skin is warm and dry.     Findings: No rash.  Neurological:     Mental Status: He is alert and oriented to person, place, and time.  Psychiatric:        Mood and Affect: Mood and affect normal.     Lab Results Lab Results  Component Value Date   WBC 6.4 02/21/2022   HGB 14.5 02/21/2022   HCT 45.0 02/21/2022   MCV 78.7 (L) 02/21/2022   PLT 262 02/21/2022    Lab Results  Component Value Date   CREATININE 1.21 02/21/2022   BUN 13 02/21/2022   NA 139 02/21/2022   K 3.9 02/21/2022   CL 106 02/21/2022   CO2 23 02/21/2022    Lab Results  Component Value Date   ALT 10 02/21/2022   AST 15 02/21/2022   ALKPHOS 75 09/29/2014   BILITOT 0.3 02/21/2022    Lab Results  Component Value Date   CHOL 209 (H) 02/21/2022   HDL 55 02/21/2022   LDLCALC 126 (H) 02/21/2022   TRIG 165 (H) 02/21/2022   CHOLHDL 3.8 02/21/2022   HIV 1 RNA Quant (Copies/mL)  Date Value  02/21/2022 <20 (H)  09/27/2020 Not Detected  02/26/2020 <20   HIV-1 RNA Viral Load (copies/mL)  Date Value  10/17/2016 29,400   CD4 T Cell Abs (/uL)  Date Value  02/21/2022 412  02/26/2020 316 (L)  08/21/2019 255 (L)   Assessment & Plan:  Assessment and Plan    HIV Stable on Juluca and Prezcobix. No concerns reported. Medications are being shipped together for ease of use. -Continue current regimen. -Ensure medications are being shipped together (updated rx today) -Does not need anal cancer screen -Refill statin for primary prevention  -VL, CD4, Renal Panel, Urinalysis, CBC and routine RPR today   Hyperlipidemia -  On medication, no concerns reported. Discussed the importance of medication in reducing risk of heart disease, especially in the context of HIV and smoking. -Continue current cholesterol medication. -Send in reorders for medication.  Tobacco Use Patient reports working on reducing smoking. -Encourage continued efforts to quit  smoking.  General Health Maintenance -Administer influenza vaccine today. -Schedule a 1-month follow-up visit.      Meds ordered this encounter  Medications   Pitavastatin Calcium 4 MG TABS    Sig: Take 1 tablet (4 mg total) by mouth daily.    Dispense:  30 tablet    Refill:  11   dolutegravir-rilpivirine (JULUCA) 50-25 MG tablet    Sig: Take 1 tablet by mouth daily.    Dispense:  30 tablet    Refill:  11    Please fill with Prezcobix    Order Specific Question:   Prescription Type:    Answer:  Renewal   darunavir-cobicistat (PREZCOBIX) 800-150 MG tablet    Sig: TAKE 1 TABLET BY MOUTH EVERY DAY. SWALLOW WHOLE. DO NOT CRUSH, BREAK OR CHEW. TAKE WITH FOOD.Marland Kitchen    Dispense:  30 tablet    Refill:  11    Please fill with the Juluca    Order Specific Question:   Prescription Type:    Answer:   Renewal   Orders Placed This Encounter  Procedures   Flu vaccine trivalent PF, 6mos and older(Flulaval,Afluria,Fluarix,Fluzone)   Pfizer Comirnaty Covid-19 Vaccine 62yrs & older   HIV 1 RNA quant-no reflex-bld   T-helper cells (CD4) count   Renal Function Panel   CBC   RPR   Return in about 9 months (around 11/28/2023).   [name], MSN, NP-C [facility]  [email or web address] Pager: [phone] Office: [phone] [facility] Main Line: [phone]

## 2023-02-27 NOTE — Patient Instructions (Addendum)
Always nice to see you!   Will refill your Juluca - Prezcobix and Pitavistatin.   Happy Holidays to you!

## 2023-02-28 LAB — T-HELPER CELLS (CD4) COUNT (NOT AT ARMC)
CD4 % Helper T Cell: 19 % — ABNORMAL LOW (ref 33–65)
CD4 T Cell Abs: 472 /uL (ref 400–1790)

## 2023-03-02 LAB — CBC
HCT: 46.1 % (ref 38.5–50.0)
Hemoglobin: 14.9 g/dL (ref 13.2–17.1)
MCH: 25.4 pg — ABNORMAL LOW (ref 27.0–33.0)
MCHC: 32.3 g/dL (ref 32.0–36.0)
MCV: 78.7 fL — ABNORMAL LOW (ref 80.0–100.0)
MPV: 10 fL (ref 7.5–12.5)
Platelets: 257 10*3/uL (ref 140–400)
RBC: 5.86 10*6/uL — ABNORMAL HIGH (ref 4.20–5.80)
RDW: 13.5 % (ref 11.0–15.0)
WBC: 5.8 10*3/uL (ref 3.8–10.8)

## 2023-03-02 LAB — RENAL FUNCTION PANEL
Albumin: 4.2 g/dL (ref 3.6–5.1)
BUN: 11 mg/dL (ref 7–25)
CO2: 23 mmol/L (ref 20–32)
Calcium: 9.5 mg/dL (ref 8.6–10.3)
Chloride: 107 mmol/L (ref 98–110)
Creat: 1.08 mg/dL (ref 0.70–1.30)
Glucose, Bld: 72 mg/dL (ref 65–99)
Phosphorus: 3.8 mg/dL (ref 2.5–4.5)
Potassium: 4.3 mmol/L (ref 3.5–5.3)
Sodium: 138 mmol/L (ref 135–146)

## 2023-03-02 LAB — RPR: RPR Ser Ql: NONREACTIVE

## 2023-03-02 LAB — HIV-1 RNA QUANT-NO REFLEX-BLD
HIV 1 RNA Quant: NOT DETECTED {copies}/mL
HIV-1 RNA Quant, Log: NOT DETECTED {Log}

## 2023-08-17 NOTE — Progress Notes (Signed)
 The 10-year ASCVD risk score (Arnett DK, et al., 2019) is: 9.4%   Values used to calculate the score:     Age: 58 years     Sex: Male     Is Non-Hispanic African American: Yes     Diabetic: No     Tobacco smoker: Yes     Systolic Blood Pressure: 108 mmHg     Is BP treated: No     HDL Cholesterol: 55 mg/dL     Total Cholesterol: 209 mg/dL  Currently prescribed pitavastatin  4 mg.  Berta Denson, BSN, RN

## 2023-08-21 ENCOUNTER — Encounter: Payer: Self-pay | Admitting: Family

## 2023-08-21 ENCOUNTER — Ambulatory Visit: Payer: Self-pay | Admitting: Family

## 2023-08-21 VITALS — BP 122/80 | HR 55 | Temp 97.8°F | Ht 70.87 in | Wt 140.6 lb

## 2023-08-21 DIAGNOSIS — Z789 Other specified health status: Secondary | ICD-10-CM

## 2023-08-21 DIAGNOSIS — B20 Human immunodeficiency virus [HIV] disease: Secondary | ICD-10-CM | POA: Diagnosis not present

## 2023-08-21 DIAGNOSIS — F17200 Nicotine dependence, unspecified, uncomplicated: Secondary | ICD-10-CM

## 2023-08-21 DIAGNOSIS — E782 Mixed hyperlipidemia: Secondary | ICD-10-CM | POA: Diagnosis not present

## 2023-08-21 DIAGNOSIS — Z1211 Encounter for screening for malignant neoplasm of colon: Secondary | ICD-10-CM

## 2023-08-21 DIAGNOSIS — Z1329 Encounter for screening for other suspected endocrine disorder: Secondary | ICD-10-CM | POA: Diagnosis not present

## 2023-08-21 DIAGNOSIS — E7209 Other disorders of amino-acid transport: Secondary | ICD-10-CM | POA: Diagnosis not present

## 2023-08-21 LAB — CBC WITH DIFFERENTIAL/PLATELET
Absolute Lymphocytes: 2365 {cells}/uL (ref 850–3900)
Absolute Monocytes: 528 {cells}/uL (ref 200–950)
Basophils Absolute: 39 {cells}/uL (ref 0–200)
Basophils Relative: 0.7 %
Eosinophils Absolute: 72 {cells}/uL (ref 15–500)
Eosinophils Relative: 1.3 %
HCT: 47 % (ref 38.5–50.0)
Hemoglobin: 14.5 g/dL (ref 13.2–17.1)
MCH: 24.9 pg — ABNORMAL LOW (ref 27.0–33.0)
MCHC: 30.9 g/dL — ABNORMAL LOW (ref 32.0–36.0)
MCV: 80.8 fL (ref 80.0–100.0)
MPV: 10.6 fL (ref 7.5–12.5)
Monocytes Relative: 9.6 %
Neutro Abs: 2497 {cells}/uL (ref 1500–7800)
Neutrophils Relative %: 45.4 %
Platelets: 254 10*3/uL (ref 140–400)
RBC: 5.82 10*6/uL — ABNORMAL HIGH (ref 4.20–5.80)
RDW: 13.7 % (ref 11.0–15.0)
Total Lymphocyte: 43 %
WBC: 5.5 10*3/uL (ref 3.8–10.8)

## 2023-08-21 LAB — TSH: TSH: 2.15 m[IU]/L (ref 0.40–4.50)

## 2023-08-21 LAB — COMPLETE METABOLIC PANEL WITHOUT GFR
AG Ratio: 1.4 (calc) (ref 1.0–2.5)
ALT: 17 U/L (ref 9–46)
AST: 20 U/L (ref 10–35)
Albumin: 4.2 g/dL (ref 3.6–5.1)
Alkaline phosphatase (APISO): 67 U/L (ref 35–144)
BUN/Creatinine Ratio: 11 (calc) (ref 6–22)
BUN: 15 mg/dL (ref 7–25)
CO2: 26 mmol/L (ref 20–32)
Calcium: 9.5 mg/dL (ref 8.6–10.3)
Chloride: 108 mmol/L (ref 98–110)
Creat: 1.31 mg/dL — ABNORMAL HIGH (ref 0.70–1.30)
Globulin: 2.9 g/dL (ref 1.9–3.7)
Glucose, Bld: 89 mg/dL (ref 65–99)
Potassium: 4.2 mmol/L (ref 3.5–5.3)
Sodium: 140 mmol/L (ref 135–146)
Total Bilirubin: 0.3 mg/dL (ref 0.2–1.2)
Total Protein: 7.1 g/dL (ref 6.1–8.1)

## 2023-08-21 LAB — LIPID PANEL
Cholesterol: 189 mg/dL (ref <200)
HDL: 54 mg/dL (ref >=40)
LDL Cholesterol (Calc): 115 mg/dL — ABNORMAL HIGH
Non-HDL Cholesterol (Calc): 135 mg/dL — ABNORMAL HIGH (ref <130)
Total CHOL/HDL Ratio: 3.5 (calc) (ref <5.0)
Triglycerides: 102 mg/dL (ref <150)

## 2023-08-21 NOTE — Progress Notes (Signed)
 Provider: Christean Courts FNP-C   Darrin Koman, Elijio Guadeloupe, NP  Patient Care Team: Taylor Spilde, Elijio Guadeloupe, NP as PCP - General (Family Medicine) Cory Dingwall, MD (Inactive) as PCP - Infectious Diseases (Infectious Diseases) Orson Blalock, NP as Nurse Practitioner (Infectious Diseases)  Extended Emergency Contact Information Primary Emergency Contact: Toso,Leslie Mobile Phone: [phone] Relation: Spouse Preferred language: English Interpreter needed? No Secondary Emergency Contact: Niblett,Maaz          [place] , [place] Home Phone: [phone] Relation: Other  Code Status:  Full Code  Goals of care: Advanced Directive information    11/26/2018    4:46 PM  Advanced Directives  Does Patient Have a Medical Advance Directive? No  Would patient like information on creating a medical advance directive? No - Patient declined     Chief Complaint  Patient presents with   New Patient (Initial Visit)    Establish Care , discuss a referral to urologist for to surgery , he was told he need to go though a doctor to get a referral.   Discussed the use of AI scribe software for clinical note transcription with the patient, who gave verbal consent to proceed.  History of Present Illness   William [name] is a 58 year old male who presents for a referral for circumcision.  He is seeking a referral for a circumcision procedure and has not yet consulted any surgical specialists. He was informed that establishing care with a primary doctor was necessary to proceed with the referral.  He has a medical history of HIV, which is stable on Juluca . He is followed by an infectious disease specialist and last saw him in December 2024. He has hyperlipidemia, which is managed with medication, and he reports no concerns regarding this condition. He has been treated for hepatitis B and C in the past.  He smokes about half a pack of cigarettes per day. He previously quit smoking for three years but has  since resumed. He works as a Curator. He typically eats two meals a day, breakfast and dinner, and occasionally lunch. He denies any significant weight changes, maintaining a stable weight of 140.6 pounds at a height of 5'11", with a BMI of 19.  No chest pain, palpitations, frequent urination, anxiety, or depression. He is up to date on most vaccinations but is due for a shingles vaccine. He has not had a colonoscopy but is considering a Cologuard test for colon cancer screening.  Past Medical History:  Diagnosis Date   Human immunodeficiency virus (HIV) disease (HCC) 01/01/2008   Dx 2009  10/2016 - E92Q INSTI mutation (DTG sensitive) developed on Genvoya     Hyperlipidemia 10/22/2014   History reviewed. No pertinent surgical history.  No Known Allergies  Allergies as of 08/21/2023   No Known Allergies      Medication List        Accurate as of August 21, 2023 11:59 PM. If you have any questions, ask your nurse or doctor.          Juluca  50-25 MG tablet Generic drug: dolutegravir -rilpivirine  Take 1 tablet by mouth daily.   multivitamin tablet Take 1 tablet by mouth daily.   Pitavastatin  Calcium  4 MG Tabs Take 1 tablet (4 mg total) by mouth daily.   Prezcobix  800-150 MG tablet Generic drug: darunavir -cobicistat  TAKE 1 TABLET BY MOUTH EVERY DAY. SWALLOW WHOLE. DO NOT CRUSH, BREAK OR CHEW. TAKE WITH FOOD.Aaron Aas        Review of Systems  Constitutional:  Negative for  appetite change, chills, fatigue, fever and unexpected weight change.  HENT:  Negative for congestion, dental problem, ear discharge, ear pain, facial swelling, hearing loss, nosebleeds, postnasal drip, rhinorrhea, sinus pressure, sinus pain, sneezing, sore throat, tinnitus and trouble swallowing.   Eyes:  Negative for pain, discharge, redness, itching and visual disturbance.  Respiratory:  Negative for cough, chest tightness, shortness of breath and wheezing.   Cardiovascular:  Negative for chest pain, palpitations  and leg swelling.  Gastrointestinal:  Negative for abdominal distention, abdominal pain, blood in stool, constipation, diarrhea, nausea and vomiting.  Endocrine: Negative for cold intolerance, heat intolerance, polydipsia, polyphagia and polyuria.  Genitourinary:  Negative for difficulty urinating, dysuria, flank pain, frequency and urgency.       Request referral to surgery for circumcision   Musculoskeletal:  Negative for arthralgias, back pain, gait problem, joint swelling, myalgias, neck pain and neck stiffness.  Skin:  Negative for color change, pallor, rash and wound.  Neurological:  Negative for dizziness, syncope, speech difficulty, weakness, light-headedness, numbness and headaches.  Hematological:  Does not bruise/bleed easily.  Psychiatric/Behavioral:  Negative for agitation, behavioral problems, confusion, hallucinations, self-injury, sleep disturbance and suicidal ideas. The patient is not nervous/anxious.     Immunization History  Administered Date(s) Administered   Hepatitis B 02/18/2008, 11/10/2008, 04/22/2009   Hepb-cpg 02/06/2019, 04/08/2019   Influenza Split 01/23/2011, 02/29/2012   Influenza Whole 02/18/2008, 04/22/2009, 11/23/2009   Influenza, Seasonal, Injecte, Preservative Fre 02/27/2023   Influenza,inj,Quad PF,6+ Mos 01/20/2014, 02/09/2015, 01/01/2017, 01/28/2018, 11/26/2018, 03/30/2020, 03/07/2021, 03/07/2022   Meningococcal Mcv4o 07/23/2018, 09/23/2018   PFIZER(Purple Top)SARS-COV-2 Vaccination 11/01/2019, 12/01/2019   PNEUMOCOCCAL CONJUGATE-20 03/07/2022   Pfizer Covid-19 Vaccine Bivalent Booster 67yrs & up 03/07/2021   Pfizer(Comirnaty)Fall Seasonal Vaccine 12 years and older 03/07/2022, 02/27/2023   Pneumococcal Conjugate-13 10/22/2014   Pneumococcal Polysaccharide-23 11/15/2007, 09/17/2012   Tdap 02/09/2015   Pertinent  Health Maintenance Due  Topic Date Due   Colonoscopy  08/20/2024 (Originally 05/05/2010)   INFLUENZA VACCINE  10/19/2023      03/30/2020     9:30 AM 03/07/2021    8:47 AM 03/07/2022    9:05 AM 02/27/2023   10:26 AM 08/21/2023    9:55 AM  Fall Risk  Falls in the past year? 0 0 0 0 0  Was there an injury with Fall?  0 0 0 0  Fall Risk Category Calculator  0 0 0 0  Fall Risk Category (Retired)  Low Low    (RETIRED) Patient Fall Risk Level  Low fall risk     Patient at Risk for Falls Due to  No Fall Risks  No Fall Risks No Fall Risks  Fall risk Follow up  Falls evaluation completed  Falls evaluation completed Falls evaluation completed   Functional Status Survey:    Vitals:   08/21/23 0953  BP: 122/80  Pulse: (!) 55  Temp: 97.8 F (36.6 C)  TempSrc: Temporal  SpO2: 95%  Weight: 140 lb 9.6 oz (63.8 kg)  Height: 5' 10.87" (1.8 m)   Body mass index is 19.68 kg/m. Physical Exam VITALS: T- 97.8, P- 55, BP- 122/80, SaO2- 95% MEASUREMENTS: Height- 5'11", Weight- 140.6, BMI- 19.0. GENERAL: Alert, cooperative, well developed, no acute distress HEENT: Normocephalic, normal oropharynx, moist mucous membranes, ears normal, nose normal, eyes normal, no sinus tenderness NECK: Neck normal CHEST: Clear to auscultation bilaterally, no wheezes, rhonchi, or crackles, chest non-tender CARDIOVASCULAR: Normal heart rate and rhythm, S1 and S2 normal without murmurs ABDOMEN: Soft, non-tender, non-distended, without organomegaly, normal bowel  sounds EXTREMITIES: No cyanosis or edema MUSCULOSKELETAL: Full range of motion in hips and knees NEUROLOGICAL: Cranial nerves grossly intact, moves all extremities without gross motor or sensory deficit   Labs reviewed: Recent Labs    02/27/23 1054 08/21/23 1053  NA 138 140  K 4.3 4.2  CL 107 108  CO2 23 26  GLUCOSE 72 89  BUN 11 15  CREATININE 1.08 1.31*  CALCIUM  9.5 9.5  PHOS 3.8  --    Recent Labs    08/21/23 1053  AST 20  ALT 17  BILITOT 0.3  PROT 7.1   Recent Labs    02/27/23 1054 08/21/23 1053  WBC 5.8 5.5  NEUTROABS  --  2,497  HGB 14.9 14.5  HCT 46.1 47.0  MCV  78.7* 80.8  PLT 257 254   Lab Results  Component Value Date   TSH 2.15 08/21/2023   Lab Results  Component Value Date   HGBA1C 5.7 (A) 11/26/2018   Lab Results  Component Value Date   CHOL 189 08/21/2023   HDL 54 08/21/2023   LDLCALC 115 (H) 08/21/2023   TRIG 102 08/21/2023   CHOLHDL 3.5 08/21/2023    Significant Diagnostic Results in last 30 days:  No results found.  Assessment/Plan     Circumcision referral He seeks a referral for circumcision and requires a primary care provider's referral to proceed. - Provide referral to general surgery for circumcision.  HIV He is well-managed on Juluca  and a fresh diet under the care of an infectious disease specialist, last seen in December 2024. He reports no issues and is compliant with his treatment regimen. - Continue Juluca  and fresh diet as prescribed by the infectious disease specialist.  Hyperlipidemia He is on medication for hyperlipidemia with no current concerns. Emphasized dietary modifications to manage cholesterol levels and discussed the risks of smoking and high cholesterol, including heart disease, stroke, and blood clots due to atherosclerosis. - Order blood work to check cholesterol levels. - Advise dietary modifications including eating lean meats, avoiding deep-fried foods, and incorporating more vegetables and plant-based proteins.  Tobacco use disorder He smokes about half a pack of cigarettes per day. Discussed the risks of smoking on cardiovascular health and the potential for heart disease and stroke. He has previously quit smoking for three years and is encouraged to quit again. Offered smoking cessation options including nicotine gum, patches, and Wellbutrin. - Discuss smoking cessation options including nicotine gum, patches, and Wellbutrin. - Encourage reduction in smoking with the goal of cessation.  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 He reports no current  symptoms and believes he was treated for it in the past. Provided education on the condition and its potential symptoms.  General Health Maintenance He is due for a shingles vaccine and has not had a colonoscopy. Discussed the importance of these preventive measures, especially given the increased risk of colon cancer in the black community. He prefers to start with a Cologuard test for colon cancer screening. - Advise shingles vaccine at the pharmacy. - Order Cologuard test for colon cancer screening.  Follow-up Advised to follow up for routine health maintenance and to ensure continuity of care. - Schedule six-month follow-up appointment. - Advise to follow up if no contact from general surgery within one to two weeks.   Family/ staff Communication: Reviewed plan of care with patient who verbalized understanding  Labs/tests ordered:  - CBC with Differential/Platelet - CMP with eGFR([facility]) - TSH - Cologuard  Next Appointment :  Return in about 6 months (around 02/20/2024) for medical mangement of chronic issues.Aaron Aas   Spent 45 minutes of Face to face and non-face to face with patient  >50% time spent counseling; reviewing medical record; tests; labs; documentation and developing future plan of care.   Estil Heman, NP

## 2023-08-24 ENCOUNTER — Ambulatory Visit: Payer: Self-pay | Admitting: Family

## 2023-08-28 ENCOUNTER — Telehealth: Admitting: Family

## 2023-08-28 NOTE — Telephone Encounter (Signed)
 Please review.   Copied from CRM [id number]. Topic: Referral - Status >> Aug 28, 2023  9:26 AM Madelyne Schiff wrote: Reason for CRM: Per Thersia Flax with [facility]  DX: Uncircumcised male  They do not perform circumcisions

## 2023-08-30 ENCOUNTER — Other Ambulatory Visit: Payer: Self-pay | Admitting: Family

## 2023-08-30 DIAGNOSIS — Z789 Other specified health status: Secondary | ICD-10-CM

## 2023-10-10 ENCOUNTER — Encounter: Payer: Self-pay | Admitting: Urology

## 2023-10-10 ENCOUNTER — Ambulatory Visit (INDEPENDENT_AMBULATORY_CARE_PROVIDER_SITE_OTHER): Admitting: Urology

## 2023-10-10 VITALS — BP 134/86 | HR 57 | Ht 71.0 in | Wt 145.0 lb

## 2023-10-10 DIAGNOSIS — N471 Phimosis: Secondary | ICD-10-CM | POA: Diagnosis not present

## 2023-10-10 DIAGNOSIS — N478 Other disorders of prepuce: Secondary | ICD-10-CM | POA: Diagnosis not present

## 2023-10-10 LAB — URINALYSIS, ROUTINE W REFLEX MICROSCOPIC
Bilirubin, UA: NEGATIVE
Ketones, UA: NEGATIVE
Leukocytes,UA: NEGATIVE
Nitrite, UA: NEGATIVE
Protein,UA: NEGATIVE
Specific Gravity, UA: 1.02 (ref 1.005–1.030)
Urobilinogen, Ur: 0.2 mg/dL (ref 0.2–1.0)
pH, UA: 5.5 (ref 5.0–7.5)

## 2023-10-10 LAB — MICROSCOPIC EXAMINATION

## 2023-10-10 NOTE — H&P (View-Only) (Signed)
 Assessment: 1. Redundant foreskin   2. Phimosis      Plan: I personally reviewed the patient's chart including provider notes. He would like to proceed with a circumcision for management of his redundant foreskin with phimosis. I discussed the procedure in detail with the patient and his wife.  I discussed potential risk including but not limited to infection, bleeding, change in sensation of the penis, and anesthetic complications.  He understands and wishes to proceed as described.  Procedure: The patient will be scheduled for circumcision at [facility].  Surgical request is placed with the surgery schedulers and will be scheduled at the patient's/family request. Informed consent is given as documented below. Anesthesia: General  The patient does not have sleep apnea, history of MRSA, history of VRE, history of cardiac device requiring special anesthetic needs. Patient is stable and considered clear for surgical management in an outpatient ambulatory surgery setting as well as inpatient hospital setting.  Consent for Operation or Procedure: Provider Certification I hereby certify that the nature, purpose, benefits, usual and most frequent risks of, and alternatives to, the operation or procedure have been explained to the patient (or person authorized to sign for the patient) either by me as responsible physician or by the provider who is to perform the operation or procedure. Time spent such that the patient/family has had an opportunity to ask questions, and that those questions have been answered. The patient or the patient's representative has been advised that selected tasks may be performed by assistants to the primary health care provider(s). I believe that the patient (or person authorized to sign for the patient) understands what has been explained, and has consented to the operation or procedure. No guarantees were implied or made.   Chief Complaint:  Chief Complaint  Patient  presents with   Redundant foreskin     History of Present Illness:  William [name] is a 58 y.o. male who is seen in consultation from Ngetich, Dinah C, NP for evaluation of redundant foreskin and phimosis. He was not circumcised as an infant.  He reports problems with retraction of his foreskin for several years with gradual worsening.  He is able to retract the foreskin with discomfort and some tearing of the foreskin.  He also reports discomfort associated with erection and sexual activity secondary to the phimosis.  No history of balanitis or UTIs.  He is interested in a circumcision.   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  Allergies:  No Known Allergies  Family History:  No family history on file.  Social History:  Social History   Tobacco Use   Smoking status: Every Day    Current packs/day: 0.50    Average packs/day: 0.5 packs/day for 30.0 years (15.0 ttl pk-yrs)    Types: Cigarettes   Smokeless tobacco: Never   Tobacco comments:    cutting down on his smoking  Vaping Use   Vaping status: Never Used  Substance Use Topics   Alcohol use: No    Alcohol/week: 0.0 standard drinks of alcohol   Drug use: No    Review of symptoms:  Constitutional:  Negative for unexplained weight loss, night sweats, fever, chills ENT:  Negative for nose bleeds, sinus pain,  painful swallowing CV:  Negative for chest pain, shortness of breath, exercise intolerance, palpitations, loss of consciousness Resp:  Negative for cough, wheezing, shortness of breath GI:  Negative for nausea, vomiting, diarrhea, bloody stools GU:  Positives noted in HPI; otherwise negative for gross hematuria, dysuria, urinary incontinence Neuro:  Negative for  seizures, poor balance, limb weakness, slurred speech Psych:  Negative for lack of energy, depression, anxiety Endocrine:  Negative for polydipsia, polyuria, symptoms of hypoglycemia (dizziness, hunger, sweating) Hematologic:  Negative for anemia, purpura, petechia, prolonged or excessive bleeding, use of anticoagulants  Allergic:  Negative for difficulty breathing or choking as a result of exposure to anything; no shellfish allergy; no allergic response (rash/itch) to materials, foods  Physical exam: BP 134/86   Pulse (!) 57   Ht 5' 11 (1.803 m)   Wt 145 lb (65.8 kg)   BMI 20.22 kg/m  GENERAL APPEARANCE:  Well appearing, well developed, well nourished, NAD HEENT: Atraumatic, Normocephalic, oropharynx clear. NECK: Supple without lymphadenopathy or thyromegaly. LUNGS: Clear to auscultation bilaterally. HEART: Regular Rate and Rhythm without murmurs, gallops, or rubs. ABDOMEN: Soft, non-tender, No Masses. EXTREMITIES: Moves all extremities well.  Without clubbing, cyanosis, or edema. NEUROLOGIC:  Alert and oriented x 3, normal gait, CN II-XII grossly intact.  MENTAL STATUS:  Appropriate. BACK:  Non-tender to palpation.  No CVAT SKIN:  Warm, dry and intact.   GU: Penis: Uncircumcised; tight opening which does allow retraction of the foreskin with some difficulty; no evidence of balanitis Meatus: Normal Scrotum: normal, no masses Testis: normal without masses bilateral   Results: U/A: 0-5 WBCs, 0-2 RBCs

## 2023-10-10 NOTE — Progress Notes (Signed)
 Assessment: 1. Redundant foreskin   2. Phimosis      Plan: I personally reviewed the patient's chart including provider notes. He would like to proceed with a circumcision for management of his redundant foreskin with phimosis. I discussed the procedure in detail with the patient and his wife.  I discussed potential risk including but not limited to infection, bleeding, change in sensation of the penis, and anesthetic complications.  He understands and wishes to proceed as described.  Procedure: The patient will be scheduled for circumcision at [facility].  Surgical request is placed with the surgery schedulers and will be scheduled at the patient's/family request. Informed consent is given as documented below. Anesthesia: General  The patient does not have sleep apnea, history of MRSA, history of VRE, history of cardiac device requiring special anesthetic needs. Patient is stable and considered clear for surgical management in an outpatient ambulatory surgery setting as well as inpatient hospital setting.  Consent for Operation or Procedure: Provider Certification I hereby certify that the nature, purpose, benefits, usual and most frequent risks of, and alternatives to, the operation or procedure have been explained to the patient (or person authorized to sign for the patient) either by me as responsible physician or by the provider who is to perform the operation or procedure. Time spent such that the patient/family has had an opportunity to ask questions, and that those questions have been answered. The patient or the patient's representative has been advised that selected tasks may be performed by assistants to the primary health care provider(s). I believe that the patient (or person authorized to sign for the patient) understands what has been explained, and has consented to the operation or procedure. No guarantees were implied or made.   Chief Complaint:  Chief Complaint  Patient  presents with   Redundant foreskin     History of Present Illness:  William [name] is a 58 y.o. male who is seen in consultation from Ngetich, Dinah C, NP for evaluation of redundant foreskin and phimosis. He was not circumcised as an infant.  He reports problems with retraction of his foreskin for several years with gradual worsening.  He is able to retract the foreskin with discomfort and some tearing of the foreskin.  He also reports discomfort associated with erection and sexual activity secondary to the phimosis.  No history of balanitis or UTIs.  He is interested in a circumcision.   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  Allergies:  No Known Allergies  Family History:  No family history on file.  Social History:  Social History   Tobacco Use   Smoking status: Every Day    Current packs/day: 0.50    Average packs/day: 0.5 packs/day for 30.0 years (15.0 ttl pk-yrs)    Types: Cigarettes   Smokeless tobacco: Never   Tobacco comments:    cutting down on his smoking  Vaping Use   Vaping status: Never Used  Substance Use Topics   Alcohol use: No    Alcohol/week: 0.0 standard drinks of alcohol   Drug use: No    Review of symptoms:  Constitutional:  Negative for unexplained weight loss, night sweats, fever, chills ENT:  Negative for nose bleeds, sinus pain,  painful swallowing CV:  Negative for chest pain, shortness of breath, exercise intolerance, palpitations, loss of consciousness Resp:  Negative for cough, wheezing, shortness of breath GI:  Negative for nausea, vomiting, diarrhea, bloody stools GU:  Positives noted in HPI; otherwise negative for gross hematuria, dysuria, urinary incontinence Neuro:  Negative for  seizures, poor balance, limb weakness, slurred speech Psych:  Negative for lack of energy, depression, anxiety Endocrine:  Negative for polydipsia, polyuria, symptoms of hypoglycemia (dizziness, hunger, sweating) Hematologic:  Negative for anemia, purpura, petechia, prolonged or excessive bleeding, use of anticoagulants  Allergic:  Negative for difficulty breathing or choking as a result of exposure to anything; no shellfish allergy; no allergic response (rash/itch) to materials, foods  Physical exam: BP 134/86   Pulse (!) 57   Ht 5' 11 (1.803 m)   Wt 145 lb (65.8 kg)   BMI 20.22 kg/m  GENERAL APPEARANCE:  Well appearing, well developed, well nourished, NAD HEENT: Atraumatic, Normocephalic, oropharynx clear. NECK: Supple without lymphadenopathy or thyromegaly. LUNGS: Clear to auscultation bilaterally. HEART: Regular Rate and Rhythm without murmurs, gallops, or rubs. ABDOMEN: Soft, non-tender, No Masses. EXTREMITIES: Moves all extremities well.  Without clubbing, cyanosis, or edema. NEUROLOGIC:  Alert and oriented x 3, normal gait, CN II-XII grossly intact.  MENTAL STATUS:  Appropriate. BACK:  Non-tender to palpation.  No CVAT SKIN:  Warm, dry and intact.   GU: Penis: Uncircumcised; tight opening which does allow retraction of the foreskin with some difficulty; no evidence of balanitis Meatus: Normal Scrotum: normal, no masses Testis: normal without masses bilateral   Results: U/A: 0-5 WBCs, 0-2 RBCs

## 2023-10-15 ENCOUNTER — Ambulatory Visit: Payer: Self-pay | Admitting: Urology

## 2023-10-15 DIAGNOSIS — N478 Other disorders of prepuce: Secondary | ICD-10-CM

## 2023-10-15 DIAGNOSIS — N471 Phimosis: Secondary | ICD-10-CM

## 2023-11-01 NOTE — Patient Instructions (Addendum)
 SURGICAL WAITING ROOM VISITATION  Patients having surgery or a procedure may have no more than 2 support people in the waiting area - these visitors may rotate.    Children under the age of 73 must have an adult with them who is not the patient.  Visitors with respiratory illnesses are discouraged from visiting and should remain at home.  If the patient needs to stay at the hospital during part of their recovery, the visitor guidelines for inpatient rooms apply. Pre-op nurse will coordinate an appropriate time for 1 support person to accompany patient in pre-op.  This support person may not rotate.    Please refer to the [facility] website for the visitor guidelines for Inpatients (after your surgery is over and you are in a regular room).    Your procedure is scheduled on: 11/06/23   Report to [facility] Main Entrance    Report to admitting at 10:30 AM   Call this number if you have problems the morning of surgery [phone]   Do not eat food or drink liquids :After Midnight.          If you have questions, please contact your surgeon's office.   FOLLOW BOWEL PREP AND ANY ADDITIONAL PRE OP INSTRUCTIONS YOU RECEIVED FROM YOUR SURGEON'S OFFICE!!!     Oral Hygiene is also important to reduce your risk of infection.                                    Remember - BRUSH YOUR TEETH THE MORNING OF SURGERY WITH YOUR REGULAR TOOTHPASTE  DENTURES WILL BE REMOVED PRIOR TO SURGERY PLEASE DO NOT APPLY Poly grip OR ADHESIVES!!!   Do NOT smoke after Midnight   Stop all vitamins and herbal supplements 7 days before surgery.   Take these medicines the morning of surgery with A SIP OF WATER: Prezcoloix, Juluca                              You may not have any metal on your body including jewelry, and body piercing             Do not wear lotions, powders, cologne, or deodorant              Men may shave face and neck.   Do not bring valuables to the hospital. [facility] IS  NOT             RESPONSIBLE   FOR VALUABLES.   Contacts, glasses, dentures or bridgework may not be worn into surgery.  DO NOT BRING YOUR HOME MEDICATIONS TO THE HOSPITAL. PHARMACY WILL DISPENSE MEDICATIONS LISTED ON YOUR MEDICATION LIST TO YOU DURING YOUR ADMISSION IN THE HOSPITAL!    Patients discharged on the day of surgery will not be allowed to drive home.  Someone NEEDS to stay with you for the first 24 hours after anesthesia.              Please read over the following fact sheets you were given: IF YOU HAVE QUESTIONS ABOUT YOUR PRE-OP INSTRUCTIONS PLEASE CALL [phone] [place].   If you received a COVID test during your pre-op visit  it is requested that you wear a mask when out in public, stay away from anyone that may not be feeling well and notify your surgeon if you develop symptoms. If you test  positive for Covid or have been in contact with anyone that has tested positive in the last 10 days please notify you surgeon.    [facility] - Preparing for Surgery Before surgery, you can play an important role.  Because skin is not sterile, your skin needs to be as free of germs as possible.  You can reduce the number of germs on your skin by washing with CHG (chlorahexidine gluconate) soap before surgery.  CHG is an antiseptic cleaner which kills germs and bonds with the skin to continue killing germs even after washing. Please DO NOT use if you have an allergy to CHG or antibacterial soaps.  If your skin becomes reddened/irritated stop using the CHG and inform your nurse when you arrive at Short Stay. Do not shave (including legs and underarms) for at least 48 hours prior to the first CHG shower.  You may shave your face/neck.  Please follow these instructions carefully:  1.  Shower with CHG Soap the night before surgery and the  morning of surgery.  2.  If you choose to wash your hair, wash your hair first as usual with your normal  shampoo.  3.  After you shampoo, rinse your hair  and body thoroughly to remove the shampoo.                             4.  Use CHG as you would any other liquid soap.  You can apply chg directly to the skin and wash.  Gently with a scrungie or clean washcloth.  5.  Apply the CHG Soap to your body ONLY FROM THE NECK DOWN.   Do   not use on face/ open                           Wound or open sores. Avoid contact with eyes, ears mouth and   genitals (private parts).                       Wash face,  Genitals (private parts) with your normal soap.             6.  Wash thoroughly, paying special attention to the area where your    surgery  will be performed.  7.  Thoroughly rinse your body with warm water from the neck down.  8.  DO NOT shower/wash with your normal soap after using and rinsing off the CHG Soap.                9.  Pat yourself dry with a clean towel.            10.  Wear clean pajamas.            11.  Place clean sheets on your bed the night of your first shower and do not  sleep with pets. Day of Surgery : Do not apply any lotions/deodorants the morning of surgery.  Please wear clean clothes to the hospital/surgery center.  FAILURE TO FOLLOW THESE INSTRUCTIONS MAY RESULT IN THE CANCELLATION OF YOUR SURGERY  PATIENT SIGNATURE_________________________________  NURSE SIGNATURE__________________________________  ________________________________________________________________________

## 2023-11-01 NOTE — Progress Notes (Addendum)
 COVID Vaccine Completed: yes  Date of COVID positive in last 90 days:  PCP - Roxan Plough, NP Cardiologist - n/a Infectious Disease- Corean Fireman, MD  Chest x-ray - n/a EKG - n/a Stress Test - n/a ECHO -  n/a Cardiac Cath - n/a Pacemaker/ICD device last checked n/a Spinal Cord Stimulator: n/a  Bowel Prep - no  Sleep Study - n/a CPAP -   Fasting Blood Sugar - n/a Checks Blood Sugar _____ times a day  Last dose of GLP1 agonist-   GLP1 instructions:  Do not take after     Last dose of SGLT-2 inhibitors-   SGLT-2 instructions:  Do not take after     Blood Thinner Instructions: Last dose: n/a  Time: Aspirin Instructions: Last Dose:  Activity level: Can go up a flight of stairs and perform activities of daily living without stopping and without symptoms of chest pain or shortness of breath.  Anesthesia review:   Patient denies shortness of breath, fever, cough and chest pain at PAT appointment  Patient verbalized understanding of instructions that were given to them at the PAT appointment. Patient was also instructed that they will need to review over the PAT instructions again at home before surgery.

## 2023-11-02 ENCOUNTER — Encounter (HOSPITAL_COMMUNITY): Payer: Self-pay

## 2023-11-02 ENCOUNTER — Other Ambulatory Visit: Payer: Self-pay

## 2023-11-02 ENCOUNTER — Encounter (HOSPITAL_COMMUNITY)
Admission: RE | Admit: 2023-11-02 | Discharge: 2023-11-02 | Disposition: A | Discharge status: Home / Self Care | Source: Ambulatory Visit | Attending: Urology | Admitting: Urology

## 2023-11-02 VITALS — BP 111/80 | HR 60 | Temp 97.7°F | Resp 16 | Ht 71.0 in | Wt 136.0 lb

## 2023-11-02 DIAGNOSIS — Z01818 Encounter for other preprocedural examination: Secondary | ICD-10-CM

## 2023-11-02 DIAGNOSIS — Z01812 Encounter for preprocedural laboratory examination: Secondary | ICD-10-CM | POA: Diagnosis present

## 2023-11-02 LAB — CBC
HCT: 48.1 % (ref 39.0–52.0)
Hemoglobin: 14.6 g/dL (ref 13.0–17.0)
MCH: 24.4 pg — ABNORMAL LOW (ref 26.0–34.0)
MCHC: 30.4 g/dL (ref 30.0–36.0)
MCV: 80.4 fL (ref 80.0–100.0)
Platelets: 255 K/uL (ref 150–400)
RBC: 5.98 MIL/uL — ABNORMAL HIGH (ref 4.22–5.81)
RDW: 15.4 % (ref 11.5–15.5)
WBC: 6.7 K/uL (ref 4.0–10.5)
nRBC: 0 % (ref 0.0–0.2)

## 2023-11-06 ENCOUNTER — Other Ambulatory Visit: Payer: Self-pay

## 2023-11-06 ENCOUNTER — Ambulatory Visit (HOSPITAL_COMMUNITY): Payer: Self-pay | Admitting: Medical

## 2023-11-06 ENCOUNTER — Encounter (HOSPITAL_COMMUNITY)
Admission: RE | Disposition: A | Discharge status: Home / Self Care | Payer: Self-pay | Source: Home / Self Care | Attending: Urology

## 2023-11-06 ENCOUNTER — Ambulatory Visit (HOSPITAL_COMMUNITY)
Admission: RE | Admit: 2023-11-06 | Discharge: 2023-11-06 | Disposition: A | Discharge status: Home / Self Care | Attending: Urology | Admitting: Urology

## 2023-11-06 ENCOUNTER — Encounter (HOSPITAL_COMMUNITY): Payer: Self-pay | Admitting: Urology

## 2023-11-06 ENCOUNTER — Ambulatory Visit (HOSPITAL_BASED_OUTPATIENT_CLINIC_OR_DEPARTMENT_OTHER)

## 2023-11-06 DIAGNOSIS — N478 Other disorders of prepuce: Secondary | ICD-10-CM | POA: Diagnosis not present

## 2023-11-06 DIAGNOSIS — N471 Phimosis: Secondary | ICD-10-CM | POA: Diagnosis not present

## 2023-11-06 DIAGNOSIS — N477 Other inflammatory diseases of prepuce: Secondary | ICD-10-CM | POA: Diagnosis not present

## 2023-11-06 HISTORY — PX: CIRCUMCISION: SHX1350

## 2023-11-06 SURGERY — CIRCUMCISION, ADULT
Anesthesia: General

## 2023-11-06 MED ORDER — LIDOCAINE HCL 1 % IJ SOLN
INTRAMUSCULAR | Status: AC
  Filled 2023-11-06: qty 20

## 2023-11-06 MED ORDER — HYDROCODONE-ACETAMINOPHEN 5-325 MG PO TABS: 1.0000 | ORAL_TABLET | Freq: Four times a day (QID) | ORAL | 0 refills | Status: DC | PRN

## 2023-11-06 MED ORDER — PHENYLEPHRINE 80 MCG/ML (10ML) SYRINGE FOR IV PUSH (FOR BLOOD PRESSURE SUPPORT)
PREFILLED_SYRINGE | INTRAVENOUS | Status: DC | PRN
  Administered 2023-11-06 (×2): 80 ug via INTRAVENOUS
  Administered 2023-11-06 (×2): 160 ug via INTRAVENOUS
  Administered 2023-11-06: 80 ug via INTRAVENOUS

## 2023-11-06 MED ORDER — FENTANYL CITRATE (PF) 100 MCG/2ML IJ SOLN
INTRAMUSCULAR | Status: DC | PRN
  Administered 2023-11-06: 100 ug via INTRAVENOUS

## 2023-11-06 MED ORDER — PHENYLEPHRINE 80 MCG/ML (10ML) SYRINGE FOR IV PUSH (FOR BLOOD PRESSURE SUPPORT)
PREFILLED_SYRINGE | INTRAVENOUS | Status: AC
  Filled 2023-11-06: qty 10

## 2023-11-06 MED ORDER — MIDAZOLAM HCL 2 MG/2ML IJ SOLN
INTRAMUSCULAR | Status: AC
  Filled 2023-11-06: qty 2

## 2023-11-06 MED ORDER — DEXAMETHASONE SODIUM PHOSPHATE 4 MG/ML IJ SOLN
INTRAMUSCULAR | Status: DC | PRN
  Administered 2023-11-06: 8 mg via INTRAVENOUS

## 2023-11-06 MED ORDER — FENTANYL CITRATE (PF) 100 MCG/2ML IJ SOLN
INTRAMUSCULAR | Status: AC
  Filled 2023-11-06: qty 2

## 2023-11-06 MED ORDER — LIDOCAINE HCL (PF) 2 % IJ SOLN
INTRAMUSCULAR | Status: AC
  Filled 2023-11-06: qty 5

## 2023-11-06 MED ORDER — BUPIVACAINE HCL (PF) 0.25 % IJ SOLN
INTRAMUSCULAR | Status: AC
  Filled 2023-11-06: qty 30

## 2023-11-06 MED ORDER — ONDANSETRON HCL 4 MG/2ML IJ SOLN
INTRAMUSCULAR | Status: AC
  Filled 2023-11-06: qty 2

## 2023-11-06 MED ORDER — ORAL CARE MOUTH RINSE: 15.0000 mL | Freq: Once | OROMUCOSAL | Status: AC

## 2023-11-06 MED ORDER — MIDAZOLAM HCL 5 MG/5ML IJ SOLN
INTRAMUSCULAR | Status: DC | PRN
Start: 2023-11-06 — End: 2023-11-06
  Administered 2023-11-06: 2 mg via INTRAVENOUS

## 2023-11-06 MED ORDER — BUPIVACAINE HCL (PF) 0.25 % IJ SOLN
INTRAMUSCULAR | Status: DC | PRN
  Administered 2023-11-06: 16 mL

## 2023-11-06 MED ORDER — 0.9 % SODIUM CHLORIDE (POUR BTL) OPTIME
TOPICAL | Status: DC | PRN
  Administered 2023-11-06: 1000 mL

## 2023-11-06 MED ORDER — PROPOFOL 10 MG/ML IV BOLUS
INTRAVENOUS | Status: DC | PRN
  Administered 2023-11-06: 200 mg via INTRAVENOUS

## 2023-11-06 MED ORDER — ALBUMIN HUMAN 5 % IV SOLN
INTRAVENOUS | Status: DC | PRN
Start: 2023-11-06 — End: 2023-11-06

## 2023-11-06 MED ORDER — ONDANSETRON HCL 4 MG/2ML IJ SOLN
INTRAMUSCULAR | Status: DC | PRN
  Administered 2023-11-06: 4 mg via INTRAVENOUS

## 2023-11-06 MED ORDER — CHLORHEXIDINE GLUCONATE 0.12 % MT SOLN
15.0000 mL | Freq: Once | OROMUCOSAL | Status: AC
  Administered 2023-11-06: 15 mL via OROMUCOSAL

## 2023-11-06 MED ORDER — LIDOCAINE HCL (CARDIAC) PF 100 MG/5ML IV SOSY
PREFILLED_SYRINGE | INTRAVENOUS | Status: DC | PRN
  Administered 2023-11-06: 60 mg via INTRAVENOUS

## 2023-11-06 MED ORDER — DEXAMETHASONE SODIUM PHOSPHATE 10 MG/ML IJ SOLN
INTRAMUSCULAR | Status: AC
  Filled 2023-11-06: qty 1

## 2023-11-06 MED ORDER — PROPOFOL 10 MG/ML IV BOLUS
INTRAVENOUS | Status: AC
  Filled 2023-11-06: qty 20

## 2023-11-06 MED ORDER — LACTATED RINGERS IV SOLN: INTRAVENOUS | Status: DC

## 2023-11-06 MED ORDER — CEFAZOLIN SODIUM-DEXTROSE 2-4 GM/100ML-% IV SOLN
2.0000 g | INTRAVENOUS | Status: AC
  Administered 2023-11-06: 2 g via INTRAVENOUS
  Filled 2023-11-06: qty 100

## 2023-11-06 SURGICAL SUPPLY — 24 items
BAG COUNTER SPONGE SURGICOUNT (BAG) IMPLANT
BLADE SURG 15 STRL LF DISP TIS (BLADE) ×1 IMPLANT
BNDG COHESIVE 2X5 TAN ST LF (GAUZE/BANDAGES/DRESSINGS) ×1 IMPLANT
COVER SURGICAL LIGHT HANDLE (MISCELLANEOUS) ×1 IMPLANT
DRAPE LAPAROTOMY T 98X78 PEDS (DRAPES) ×1 IMPLANT
ELECT REM PT RETURN 15FT ADLT (MISCELLANEOUS) ×1 IMPLANT
GAUZE 4X4 16PLY ~~LOC~~+RFID DBL (SPONGE) ×1 IMPLANT
GAUZE PETROLATUM 1 X8 (GAUZE/BANDAGES/DRESSINGS) ×1 IMPLANT
GAUZE STRETCH 2X75IN STRL (MISCELLANEOUS) IMPLANT
GLOVE BIO SURGEON STRL SZ8.5 (GLOVE) ×1 IMPLANT
GOWN STRL REUS W/ TWL XL LVL3 (GOWN DISPOSABLE) ×1 IMPLANT
KIT BASIN OR (CUSTOM PROCEDURE TRAY) ×1 IMPLANT
KIT TURNOVER KIT A (KITS) ×1 IMPLANT
NDL HYPO 22X1.5 SAFETY MO (MISCELLANEOUS) ×1 IMPLANT
NEEDLE HYPO 22X1.5 SAFETY MO (MISCELLANEOUS) ×1 IMPLANT
NS IRRIG 1000ML POUR BTL (IV SOLUTION) ×1 IMPLANT
PACK BASIC VI WITH GOWN DISP (CUSTOM PROCEDURE TRAY) ×1 IMPLANT
PENCIL SMOKE EVACUATOR (MISCELLANEOUS) IMPLANT
SUT CHROMIC 3 0 PS 2 (SUTURE) IMPLANT
SUT CHROMIC 3 0 SH 27 (SUTURE) ×2 IMPLANT
SUT SILK 2-0 18XBRD TIE 12 (SUTURE) IMPLANT
SYR CONTROL 10ML LL (SYRINGE) IMPLANT
TOWEL OR 17X26 10 PK STRL BLUE (TOWEL DISPOSABLE) ×1 IMPLANT
WATER STERILE IRR 1000ML POUR (IV SOLUTION) IMPLANT

## 2023-11-06 NOTE — Interval H&P Note (Signed)
 History and Physical Interval Note:  11/06/2023 12:30 PM  William [name] [employer].  has presented today for surgery, with the diagnosis of Redundant prepuce and phimosis.  The various methods of treatment have been discussed with the patient and family. After consideration of risks, benefits and other options for treatment, the patient has consented to  Procedure(s): CIRCUMCISION, ADULT (N/A) as a surgical intervention.  The patient's history has been reviewed, patient examined, no change in status, stable for surgery.  I have reviewed the patient's chart and labs.  Questions were answered to the patient's satisfaction.     [name]

## 2023-11-06 NOTE — Anesthesia Procedure Notes (Signed)
 Procedure Name: LMA Insertion Date/Time: 11/06/2023 12:50 PM  Performed by: Landy Chip HERO, CRNAPre-anesthesia Checklist: Patient identified, Emergency Drugs available, Suction available and Patient being monitored Patient Re-evaluated:Patient Re-evaluated prior to induction Oxygen Delivery Method: Circle System Utilized Preoxygenation: Pre-oxygenation with 100% oxygen Induction Type: IV induction Ventilation: Mask ventilation without difficulty LMA: LMA with gastric port inserted LMA Size: 4.0 Number of attempts: 1 Airway Equipment and Method: Bite block Placement Confirmation: positive ETCO2 Tube secured with: Tape Dental Injury: Teeth and Oropharynx as per pre-operative assessment

## 2023-11-06 NOTE — Transfer of Care (Signed)
 Immediate Anesthesia Transfer of Care Note  Patient: William [name].  Procedure(s) Performed: CIRCUMCISION, ADULT  Patient Location: PACU  Anesthesia Type:General  Level of Consciousness: drowsy  Airway & Oxygen Therapy: Patient Spontanous Breathing and Patient connected to face mask oxygen  Post-op Assessment: Report given to RN and Post -op Vital signs reviewed and stable  Post vital signs: Reviewed and stable  Last Vitals:  Vitals Value Taken Time  BP 121/85 11/06/23 13:43  Temp    Pulse 51 11/06/23 13:45  Resp 18 11/06/23 13:45  SpO2 100 % 11/06/23 13:45  Vitals shown include unfiled device data.  Last Pain:  Vitals:   11/06/23 1025  TempSrc:   PainSc: 0-No pain      Patients Stated Pain Goal: 4 (11/06/23 1025)  Complications: No notable events documented.

## 2023-11-06 NOTE — Anesthesia Preprocedure Evaluation (Signed)
 Anesthesia Evaluation  Patient identified by MRN, date of birth, ID band Patient awake    Reviewed: Allergy & Precautions, H&P , NPO status , Patient's Chart, lab work & pertinent test results  Airway Mallampati: II   Neck ROM: full    Dental   Pulmonary Current Smoker and Patient abstained from smoking.   breath sounds clear to auscultation       Cardiovascular negative cardio ROS  Rhythm:regular Rate:Normal     Neuro/Psych    GI/Hepatic   Endo/Other    Renal/GU      Musculoskeletal   Abdominal   Peds  Hematology  (+) HIV  Anesthesia Other Findings   Reproductive/Obstetrics                              Anesthesia Physical Anesthesia Plan  ASA: 2  Anesthesia Plan: General   Post-op Pain Management:    Induction: Intravenous  PONV Risk Score and Plan: 1 and Ondansetron , Dexamethasone , Midazolam  and Treatment may vary due to age or medical condition  Airway Management Planned: LMA  Additional Equipment:   Intra-op Plan:   Post-operative Plan: Extubation in OR  Informed Consent: I have reviewed the patients History and Physical, chart, labs and discussed the procedure including the risks, benefits and alternatives for the proposed anesthesia with the patient or authorized representative who has indicated his/her understanding and acceptance.     Dental advisory given  Plan Discussed with: CRNA, Anesthesiologist and Surgeon  Anesthesia Plan Comments:         Anesthesia Quick Evaluation

## 2023-11-06 NOTE — Anesthesia Postprocedure Evaluation (Signed)
 Anesthesia Post Note  Patient: William [name].  Procedure(s) Performed: CIRCUMCISION, ADULT     Patient location during evaluation: PACU Anesthesia Type: General Level of consciousness: awake and alert Pain management: pain level controlled Vital Signs Assessment: post-procedure vital signs reviewed and stable Respiratory status: spontaneous breathing, nonlabored ventilation, respiratory function stable and patient connected to nasal cannula oxygen Cardiovascular status: blood pressure returned to baseline and stable Postop Assessment: no apparent nausea or vomiting Anesthetic complications: no   No notable events documented.  Last Vitals:  Vitals:   11/06/23 1422 11/06/23 1430  BP: 126/86 126/86  Pulse: 60 62  Resp: 12   Temp:  (!) 36.4 C  SpO2: 96% 96%    Last Pain:  Vitals:   11/06/23 1430  TempSrc:   PainSc: 0-No pain                 Ishana Blades S

## 2023-11-06 NOTE — Op Note (Signed)
 OPERATIVE NOTE   Patient Name: William [name].  MRN: [id number]   Date of Procedure: 11/06/23    Preoperative diagnosis:  Redundant foreskin with phimosis  Postoperative diagnosis:  Redundant foreskin with phimosis  Procedure:  Circumcision  Attending: Adine DOROTHA Manly, MD  Anesthesia: General With local  Estimated blood loss: 5 mL  Fluids: Per anesthesia record  Drains: None  Specimens: Foreskin  Antibiotics: Ancef  2 g IV  Findings: Redundant foreskin with phimosis; normal meatus  Indications:  58 year old male presents for surgical management of redundant foreskin with phimosis.  He has had some intermittent problems with retraction of his foreskin for several years with gradual worsening.  He is able to retract the foreskin with some discomfort and tearing of the foreskin.  He also reports discomfort associated with erections and sexual activity.  No history of balanitis or UTIs.  He presents now for circumcision.  The procedure including potential risk discussed with the patient.  He understands wishes to proceed as described.  Description of Procedure:  The patient was taken to the operating room suite and properly identified.  He received IV Ancef  preoperatively.  After successful induction of a general anesthetic, he was placed on the operating room table in the supine position.  The patient's genital area was prepped and draped in sterile fashion.  A preoperative timeout was performed.  Examination demonstrated a redundant foreskin with some mild phimosis.  No other lesions were seen.  The glans and meatus were normal.  A circumferential incision was made on the outer preputial skin behind the glans.  A similar incision was made on the penile shaft approximately 1 cm proximal to the coronal sulcus.  The excess skin was excised using cautery.  Hemostasis was obtained with the cautery.  Specimen was sent to pathology.  The skin edges were then approximated using  interrupted 3-0 chromic suture.  An excellent closure was obtained with good hemostasis.  A penile block was performed using 16 mL of 0.25% plain Marcaine .  A sterile dressing was applied using Vaseline gauze, Kling, and Coban.  The patient was then extubated and taken to the postanesthesia care unit in stable condition.  All counts were correct at the end the procedure.  Complications: None  Condition: Stable, extubated, transferred to PACU  Plan:  Discharge to home Remove bandage in 48 hours and begin local wound care.

## 2023-11-07 ENCOUNTER — Encounter (HOSPITAL_COMMUNITY): Payer: Self-pay | Admitting: Urology

## 2023-11-07 LAB — SURGICAL PATHOLOGY

## 2023-11-23 ENCOUNTER — Other Ambulatory Visit: Payer: Self-pay

## 2023-11-23 DIAGNOSIS — Z113 Encounter for screening for infections with a predominantly sexual mode of transmission: Secondary | ICD-10-CM

## 2023-11-23 DIAGNOSIS — B20 Human immunodeficiency virus [HIV] disease: Secondary | ICD-10-CM

## 2023-11-27 ENCOUNTER — Other Ambulatory Visit: Payer: Self-pay

## 2023-11-27 ENCOUNTER — Other Ambulatory Visit: Payer: Medicaid Other

## 2023-11-27 ENCOUNTER — Other Ambulatory Visit (HOSPITAL_COMMUNITY)
Admission: RE | Admit: 2023-11-27 | Discharge: 2023-11-27 | Disposition: A | Discharge status: Home / Self Care | Source: Ambulatory Visit | Attending: Infectious Diseases | Admitting: Infectious Diseases

## 2023-11-27 DIAGNOSIS — B20 Human immunodeficiency virus [HIV] disease: Secondary | ICD-10-CM

## 2023-11-27 DIAGNOSIS — Z113 Encounter for screening for infections with a predominantly sexual mode of transmission: Secondary | ICD-10-CM

## 2023-11-27 LAB — T-HELPER CELL (CD4) - (RCID CLINIC ONLY)
CD4 % Helper T Cell: 17 % — ABNORMAL LOW (ref 33–65)
CD4 T Cell Abs: 434 /uL (ref 400–1790)

## 2023-11-28 LAB — URINE CYTOLOGY ANCILLARY ONLY
Chlamydia: NEGATIVE
Comment: NEGATIVE
Comment: NORMAL
Neisseria Gonorrhea: NEGATIVE

## 2023-11-29 LAB — CBC WITH DIFFERENTIAL/PLATELET
Absolute Lymphocytes: 2832 {cells}/uL (ref 850–3900)
Absolute Monocytes: 513 {cells}/uL (ref 200–950)
Basophils Absolute: 41 {cells}/uL (ref 0–200)
Basophils Relative: 0.7 %
Eosinophils Absolute: 100 {cells}/uL (ref 15–500)
Eosinophils Relative: 1.7 %
HCT: 48.7 % (ref 38.5–50.0)
Hemoglobin: 15.3 g/dL (ref 13.2–17.1)
MCH: 25.1 pg — ABNORMAL LOW (ref 27.0–33.0)
MCHC: 31.4 g/dL — ABNORMAL LOW (ref 32.0–36.0)
MCV: 80 fL (ref 80.0–100.0)
MPV: 11 fL (ref 7.5–12.5)
Monocytes Relative: 8.7 %
Neutro Abs: 2413 {cells}/uL (ref 1500–7800)
Neutrophils Relative %: 40.9 %
Platelets: 270 Thousand/uL (ref 140–400)
RBC: 6.09 Million/uL — ABNORMAL HIGH (ref 4.20–5.80)
RDW: 14.2 % (ref 11.0–15.0)
Total Lymphocyte: 48 %
WBC: 5.9 Thousand/uL (ref 3.8–10.8)

## 2023-11-29 LAB — COMPLETE METABOLIC PANEL WITHOUT GFR
AG Ratio: 1.4 (calc) (ref 1.0–2.5)
ALT: 18 U/L (ref 9–46)
AST: 18 U/L (ref 10–35)
Albumin: 4.2 g/dL (ref 3.6–5.1)
Alkaline phosphatase (APISO): 71 U/L (ref 35–144)
BUN: 12 mg/dL (ref 7–25)
CO2: 25 mmol/L (ref 20–32)
Calcium: 9.5 mg/dL (ref 8.6–10.3)
Chloride: 105 mmol/L (ref 98–110)
Creat: 1.07 mg/dL (ref 0.70–1.30)
Globulin: 2.9 g/dL (ref 1.9–3.7)
Glucose, Bld: 104 mg/dL — ABNORMAL HIGH (ref 65–99)
Potassium: 4 mmol/L (ref 3.5–5.3)
Sodium: 138 mmol/L (ref 135–146)
Total Bilirubin: 0.3 mg/dL (ref 0.2–1.2)
Total Protein: 7.1 g/dL (ref 6.1–8.1)

## 2023-11-29 LAB — HIV-1 RNA QUANT-NO REFLEX-BLD
HIV 1 RNA Quant: NOT DETECTED {copies}/mL
HIV-1 RNA Quant, Log: NOT DETECTED {Log_copies}/mL

## 2023-11-29 LAB — RPR: RPR Ser Ql: NONREACTIVE

## 2023-11-30 ENCOUNTER — Ambulatory Visit: Admitting: Urology

## 2023-11-30 ENCOUNTER — Encounter: Payer: Self-pay | Admitting: Urology

## 2023-11-30 VITALS — BP 121/75 | HR 69

## 2023-11-30 DIAGNOSIS — Z9889 Other specified postprocedural states: Secondary | ICD-10-CM

## 2023-11-30 DIAGNOSIS — N471 Phimosis: Secondary | ICD-10-CM

## 2023-11-30 LAB — URINALYSIS, ROUTINE W REFLEX MICROSCOPIC
Bilirubin, UA: NEGATIVE
Leukocytes,UA: NEGATIVE
Nitrite, UA: NEGATIVE
Specific Gravity, UA: 1.025 (ref 1.005–1.030)
Urobilinogen, Ur: 1 mg/dL (ref 0.2–1.0)
pH, UA: 6 (ref 5.0–7.5)

## 2023-11-30 LAB — MICROSCOPIC EXAMINATION

## 2023-11-30 NOTE — Progress Notes (Signed)
   Assessment: 1. Phimosis   2. S/P routine circumcision     Plan: He is doing well following his circumcision. Return to office prn  Chief Complaint:  Chief Complaint  Patient presents with   Phimosis    History of Present Illness:  William [name]. is a 58 y.o. male who is seen for further evaluation of redundant foreskin and phimosis. He was not circumcised as an infant.  He reported problems with retraction of his foreskin for several years with gradual worsening.  He was able to retract the foreskin with discomfort and some tearing of the foreskin.  He also reported discomfort associated with erection and sexual activity secondary to the phimosis.  No history of balanitis or UTIs.   He is s/p circumcision on 11/06/23.  Path showed benign squamous epithelium in subcutaneous soft tissue with chronic inflammation and reactive changes.  He returns today for follow-up.  He has done well since the surgery.  His incision is well-healed.  No pain.  Portions of the above documentation were copied from a prior visit for review purposes only.   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  Social History:  Social History   Tobacco Use   Smoking status: Every Day    Current packs/day: 0.50    Average packs/day: 0.5 packs/day for 30.0 years (15.0 ttl pk-yrs)    Types: Cigarettes    Passive exposure: Current   Smokeless tobacco: Never   Tobacco comments:     cutting down on his smoking  Vaping Use   Vaping status: Never Used  Substance Use Topics   Alcohol use: No    Alcohol/week: 0.0 standard drinks of alcohol   Drug use: No    ROS: Constitutional:  Negative for fever, chills, weight loss CV: Negative for chest pain, previous MI, hypertension Respiratory:  Negative for shortness of breath, wheezing, sleep apnea, frequent cough GI:  Negative for nausea, vomiting, bloody stool, GERD  Physical exam: BP 121/75   Pulse 69  GENERAL APPEARANCE:  Well appearing, well developed, well nourished, NAD GU: Circumcision is well-healed.  Results: U/A: 0-5 WBCs, 0-2 RBCs

## 2023-12-11 ENCOUNTER — Encounter: Payer: Self-pay | Admitting: Infectious Diseases

## 2023-12-11 ENCOUNTER — Ambulatory Visit (INDEPENDENT_AMBULATORY_CARE_PROVIDER_SITE_OTHER): Payer: Self-pay | Admitting: Infectious Diseases

## 2023-12-11 ENCOUNTER — Other Ambulatory Visit: Payer: Self-pay

## 2023-12-11 VITALS — BP 109/73 | HR 64 | Temp 97.7°F | Wt 140.0 lb

## 2023-12-11 DIAGNOSIS — E7209 Other disorders of amino-acid transport: Secondary | ICD-10-CM

## 2023-12-11 DIAGNOSIS — Z125 Encounter for screening for malignant neoplasm of prostate: Secondary | ICD-10-CM | POA: Diagnosis not present

## 2023-12-11 DIAGNOSIS — B20 Human immunodeficiency virus [HIV] disease: Secondary | ICD-10-CM | POA: Diagnosis not present

## 2023-12-11 DIAGNOSIS — Z23 Encounter for immunization: Secondary | ICD-10-CM | POA: Diagnosis not present

## 2023-12-11 MED ORDER — PREZCOBIX 800-150 MG PO TABS: ORAL_TABLET | ORAL | 11 refills | Status: AC

## 2023-12-11 MED ORDER — JULUCA 50-25 MG PO TABS: 1.0000 | ORAL_TABLET | Freq: Every day | ORAL | 11 refills | Status: AC

## 2023-12-11 MED ORDER — ZOSTER VAC RECOMB ADJUVANTED 50 MCG/0.5ML IM SUSR: 0.5000 mL | INTRAMUSCULAR | 1 refills | Status: AC

## 2023-12-11 MED ORDER — PITAVASTATIN CALCIUM 4 MG PO TABS: 1.0000 | ORAL_TABLET | Freq: Every day | ORAL | 11 refills | Status: AC

## 2023-12-11 NOTE — Progress Notes (Signed)
 I saw the patient independent of SNP and agree with findings with the following additions:    Discussed the use of AI scribe software for clinical note transcription with the patient, who gave verbal consent to proceed.  History of Present Illness   William [name]. is a 58 year old male who presents for follow-up and shingles vaccination.   He is currently taking Juluca  and Prezcobix  for HIV management, with no changes in his medication regimen. He does not take multivitamins or acid reducers and has been advised to consult before using any over-the-counter heartburn medications due to potential interactions with his HIV medications.  He has not yet received the Shingrix vaccine, which requires two doses spaced two months apart. The vaccine must be administered at a pharmacy for insurance coverage. He recently received a flu shot and is considering the timing of the shingles vaccine.  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 His kidney function remains stable, and he is under the care of nephrology. His serum blood glucose levels are consistently normal despite the presence of glycosuria.  In terms of social history, he continues to smoke and works intermittently. His mood and sleep are stable, and he has no weight concerns.      Physical Exam Constitutional:      Appearance: Normal appearance. He is not ill-appearing.  HENT:     Head: Normocephalic.     Mouth/Throat:     Mouth: Mucous membranes are moist.     Pharynx: Oropharynx is clear.  Eyes:     General: No scleral icterus. Cardiovascular:     Rate and Rhythm: Normal rate.  Pulmonary:     Effort: Pulmonary effort is normal.  Musculoskeletal:        General: Normal range of motion.     Cervical back: Normal range of motion.  Skin:    Coloration: Skin is not jaundiced or pale.  Neurological:     Mental Status: He is alert and  oriented to person, place, and time.  Psychiatric:        Mood and Affect: Mood normal.        Judgment: Judgment normal.     Assessment and Plan    Well-controlled HIV infection - HIV infection is well-controlled with undetectable viral load and strong immune system. Current antiretroviral therapy includes Juluca  and Prezcobix . - Continue current antiretroviral therapy with Juluca  and Prezcobix . - Ensure refills for Juluca  and Prezcobix  are sent. - Advise to consult before using any multivitamins or acid reducers that may interfere with Juluca  absorption.  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 It is secondary to tenofovir  exposure, resulting in glycosuria without elevated serum glucose levels. The condition is not expected to progress to kidney disease. - Continue monitoring renal function and phosphate levels. - No current need for referral back to nephrology as per previous nephrology guidance.  Immunization: Shingrix (shingles vaccine) administration and counseling - Discussed the importance of the Shingrix vaccine for shingles prevention, especially recommended for individuals over 50 or those with certain health conditions. Explained that the vaccine is administered in two doses, spaced two months apart. Discussed the common side effects, particularly after the second dose, which may include sore arm, chills, and body aches, but emphasized that these are manageable with over-the-counter medications and  are preferable to the pain of shingles. - Provide a written prescription for the Shingrix vaccine. - Instruct him to receive the vaccine at a pharmacy for insurance coverage. - Advise him to schedule the second dose two months after the first.  General Health Maintenance - Reviewed immunizations and screenings. He is up to  date on Cologuard and flu vaccine. Discussed the need for PSA screening and lipid monitoring. - Discuss PSA screening and lipid monitoring with PCP.       Corean Fireman, MSN, NP-C [facility]  [email or web address] Pager: [phone] Office: [phone] [facility] Main Line: [phone] *Secure Chat Communication Welcome

## 2023-12-11 NOTE — Progress Notes (Signed)
 Patient: William [name].  DOB: 1965-05-26 MRN: [id number] PCP: Leonarda Roxan BROCKS, NP  Referring Provider:   Reason for Visit: Follow-Up for HIV Management   Chief Complaint  Patient presents with   Follow-up    Flu shot/      Subjective   Subjective:   William [name] is a 58 year old male that is being seen in clinic today for follow-up on HIV management. Since his last visit, he states his good has been good and stable. Since his last visit he states he underwent surgery for circumcision, recovering well and is being followed by urology. He has no issues obtaining his medication and states he is adherent to his ARV regimen. He denies starting new medications since his last visit. He denies new sexual partners at this time. He denies drinking alcohol or using illegal substance but continues to smoking cigarettes, about half a pack a day. He states no issues to housing, food, or medications. Preventive care reviewed up to date including colorectal screening. He also states he no longer sees the dentist and has an upcoming appointment with his eye doctor soon. Immunization reviewed and up to date. He denies fever, chills, night swears, chest pain, or shortness of breath. He has no other concerns at this time.    Review of Systems  All other systems reviewed and are negative.   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 15 tablet 0   darunavir -cobicistat  (PREZCOBIX ) 800-150 MG tablet TAKE 1 TABLET BY MOUTH EVERY DAY. SWALLOW WHOLE. DO NOT CRUSH, BREAK OR CHEW. TAKE WITH FOOD.. 30 tablet 11    dolutegravir -rilpivirine  (JULUCA ) 50-25 MG tablet Take 1 tablet by mouth daily. 30 tablet 11   Pitavastatin  Calcium  4 MG TABS Take 1 tablet (4 mg total) by mouth daily. 30 tablet 11   No facility-administered medications prior to visit.     No Known Allergies  Social History   Tobacco Use   Smoking status: Every Day    Current packs/day: 0.50    Average packs/day: 0.5 packs/day for 30.0 years (15.0 ttl pk-yrs)    Types: Cigarettes    Passive exposure: Current   Smokeless tobacco: Never   Tobacco comments:    cutting down on his smoking  Vaping Use   Vaping status: Never Used  Substance Use Topics   Alcohol use: No    Alcohol/week: 0.0 standard drinks of alcohol   Drug use: No    No family history on file.     Objective   Objective:   Vitals:   12/11/23 0856  BP: 109/73  Pulse: 64  Temp: 97.7 F (36.5 C)  TempSrc: Oral  SpO2: 97%  Weight: 140 lb (63.5 kg)   Body mass index is 19.53 kg/m.  Physical Exam Constitutional:      Appearance: Normal appearance.  Cardiovascular:     Rate and Rhythm: Normal rate and regular rhythm.  Pulmonary:     Effort: Pulmonary effort is normal.  Musculoskeletal:        General: Normal range of motion.  Neurological:     Mental  Status: He is alert and oriented to person, place, and time.  Psychiatric:        Mood and Affect: Mood normal.        Behavior: Behavior normal.        Thought Content: Thought content normal.        Judgment: Judgment normal.        Assessment & Plan:   Problem List Items Addressed This Visit       High   Human immunodeficiency virus (HIV) disease (HCC) - Primary (Chronic)   Relevant Medications   Zoster Vaccine Adjuvanted (SHINGRIX) injection   Pitavastatin  Calcium  4 MG TABS   dolutegravir -rilpivirine  (JULUCA ) 50-25 MG tablet   darunavir -cobicistat  (PREZCOBIX ) 800-150 MG tablet   Other Relevant Orders   Renal Function Panel   HIV 1 RNA quant-no reflex-bld   T-helper cells (CD4)  count   Lipid panel   Other Visit Diagnoses       Need for influenza vaccination       Relevant Orders   Flu vaccine trivalent PF, 6mos and older(Flulaval,Afluria,Fluarix,Fluzone) (Completed)     Screening for prostate cancer       Relevant Orders   PSA       Assessment and Plan  -HIV Management Current regiment includes Juluca  and Prezcobix . Last CD4 at 434 and HIV-1 RNA is not detected. He has no side effects from medication and no issues obtaining and good adherence to medication. Continue with current ARV regimen. Plan to obtain CD4, HIV-1 RNA, and CMP for next visit.  -Dyslipidemia  Current regiment includes Pitavastatin  4mg . Total cholesterol and triglycerides within range. LDL cholesterol slightly above range at 115. Continue with current regimen.   -Smoking Cessation  He states he smoke about half-pack of cigarettes a day. He was educated on the risk of cardiovascular impact and smoking, verbalized understanding and will continue to work on quitting.   -Health Maintenance  Preventive care reviewed and up-to-date including colorectal screening. Immunizations reviewed as well and he is agreeable to Shingrix vaccine. Will provide him with script for vaccine. Plan to obtain PSA for next visit.     Orders Placed This Encounter  Procedures   Flu vaccine trivalent PF, 6mos and older(Flulaval,Afluria,Fluarix,Fluzone)   Renal Function Panel    Standing Status:   Future    Expected Date:   09/09/2024    Expiration Date:   12/08/2024   HIV 1 RNA quant-no reflex-bld    Standing Status:   Future    Expected Date:   09/09/2024    Expiration Date:   12/08/2024   T-helper cells (CD4) count    Standing Status:   Future    Expected Date:   09/09/2024    Expiration Date:   12/08/2024   Lipid panel    Standing Status:   Future    Expected Date:   09/09/2024    Expiration Date:   12/08/2024   PSA    Standing Status:   Future    Expected Date:   09/09/2024    Expiration Date:    12/08/2024    Meds ordered this encounter  Medications   Zoster Vaccine Adjuvanted [facility]) injection    Sig: Inject 0.5 mLs into the muscle every 8 (eight) weeks.    Dispense:  0.5 mL    Refill:  1   Pitavastatin  Calcium  4 MG TABS    Sig: Take 1 tablet (4 mg total) by mouth daily.    Dispense:  30 tablet  Refill:  11   dolutegravir -rilpivirine  (JULUCA ) 50-25 MG tablet    Sig: Take 1 tablet by mouth daily.    Dispense:  30 tablet    Refill:  11    Please fill with Prezcobix     Prescription Type::   Renewal   darunavir -cobicistat  (PREZCOBIX ) 800-150 MG tablet    Sig: TAKE 1 TABLET BY MOUTH EVERY DAY. SWALLOW WHOLE. DO NOT CRUSH, BREAK OR CHEW. TAKE WITH FOOD.SABRA    Dispense:  30 tablet    Refill:  11    Please fill with the Juluca     Prescription Type::   Renewal    No follow-ups on file.   Corean Fireman, MSN, NP-C [facility]  [email or web address] Pager: [phone] Office: [phone] [facility] Main Line: [phone] *Secure Chat Communication Welcome

## 2024-02-20 ENCOUNTER — Ambulatory Visit: Payer: Self-pay | Admitting: Family

## 2024-02-20 ENCOUNTER — Encounter: Payer: Self-pay | Admitting: Family

## 2024-02-20 VITALS — BP 136/84 | HR 73 | Temp 98.8°F | Ht 71.0 in | Wt 138.8 lb

## 2024-02-20 DIAGNOSIS — R7303 Prediabetes: Secondary | ICD-10-CM | POA: Diagnosis not present

## 2024-02-20 DIAGNOSIS — F17200 Nicotine dependence, unspecified, uncomplicated: Secondary | ICD-10-CM | POA: Diagnosis not present

## 2024-02-20 DIAGNOSIS — E782 Mixed hyperlipidemia: Secondary | ICD-10-CM

## 2024-02-20 LAB — HEMOGLOBIN A1C
Hgb A1c MFr Bld: 5.7 % — ABNORMAL HIGH (ref <5.7)
Mean Plasma Glucose: 117 mg/dL
eAG (mmol/L): 6.5 mmol/L

## 2024-02-20 NOTE — Progress Notes (Signed)
 Provider: Roxan Plough FNP-C   Graysyn Bache, Roxan BROCKS, NP  Patient Care Team: Nattalie Santiesteban, Roxan BROCKS, NP as PCP - General (Family Medicine) Elaine Rush, MD (Inactive) as PCP - Infectious Diseases (Infectious Diseases) Melvenia Corean SAILOR, NP as Nurse Practitioner (Infectious Diseases)  Extended Emergency Contact Information Primary Emergency Contact: Sherry,Leslie Home Phone: [phone] Mobile Phone: [phone] Relation: Spouse Preferred language: English Interpreter needed? No  Code Status:  Full Code  Goals of care: Advanced Directive information    02/20/2024    8:58 AM  Advanced Directives  Does Patient Have a Medical Advance Directive? No  Would patient like information on creating a medical advance directive? No - Patient declined     Chief Complaint  Patient presents with   Medical Management of Chronic Issues    6 Month follow up.     Discussed the use of AI scribe software for clinical note transcription with the patient, who gave verbal consent to proceed.  History of Present Illness   William [name]. is a 58 year old male who presents for a six-month follow-up visit.  He maintains a stable weight with a slight fluctuation of about two pounds since his last visit in June, attributed to staying busy and engaging in a lot of walking.  He is currently taking Juluca  25 mg daily and Prezcobix  Hydrocodone  5/325 mg was previously prescribed for an unspecified condition but has been discontinued.  He has a history of elevated glucose levels, which have been investigated without a definitive cause. Despite fasting, his last blood work showed high glucose levels. He has been screened for diabetes in the past, and it was determined that he does not have it. His cholesterol levels have been monitored, with a noted decrease in total cholesterol and triglycerides over the past year, although his LDL cholesterol remains slightly elevated.  No current pain, anxiety,  depression, or bowel issues. He experiences occasional nasal congestion, which has improved recently.  He smokes less than half a pack of cigarettes a day and is attempting to quit, aiming to make it a New Year's resolution.    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 If you have any questions, ask your nurse or doctor.          STOP taking these medications    HYDROcodone -acetaminophen  5-325 MG tablet Commonly known as: NORCO/VICODIN Stopped by: Dierre Crevier C Airyonna Franklyn       TAKE these medications    Juluca  50-25 MG tablet Generic drug: dolutegravir -rilpivirine  Take 1 tablet by mouth daily.   Pitavastatin  Calcium  4 MG Tabs Take 1 tablet (4 mg total) by mouth daily.   Prezcobix  800-150 MG tablet Generic drug: darunavir -cobicistat  TAKE 1 TABLET BY MOUTH EVERY DAY. SWALLOW WHOLE. DO NOT CRUSH, BREAK OR CHEW. TAKE WITH FOOD.SABRA   Zoster Vaccine Adjuvanted injection Commonly known as: SHINGRIX Inject 0.5 mLs into  the muscle every 8 (eight) weeks.        Review of Systems  Constitutional:  Negative for appetite change, chills, fatigue, fever and unexpected weight change.  HENT:  Negative for congestion, dental problem, ear discharge, ear pain, facial swelling, hearing loss, nosebleeds, postnasal drip, rhinorrhea, sinus pressure, sinus pain, sneezing, sore throat, tinnitus and trouble swallowing.   Eyes:  Negative for pain, discharge, redness,  itching and visual disturbance.  Respiratory:  Negative for cough, chest tightness, shortness of breath and wheezing.   Cardiovascular:  Negative for chest pain, palpitations and leg swelling.  Gastrointestinal:  Negative for abdominal distention, abdominal pain, blood in stool, constipation, diarrhea, nausea and vomiting.  Endocrine: Negative for cold intolerance, heat intolerance, polydipsia, polyphagia and polyuria.  Genitourinary:  Negative for difficulty urinating, dysuria, flank pain, frequency and urgency.  Musculoskeletal:  Negative for arthralgias, back pain, gait problem, joint swelling, myalgias, neck pain and neck stiffness.  Skin:  Negative for color change, pallor, rash and wound.  Neurological:  Negative for dizziness, syncope, speech difficulty, weakness, light-headedness, numbness and headaches.  Hematological:  Does not bruise/bleed easily.  Psychiatric/Behavioral:  Negative for agitation, behavioral problems, confusion, hallucinations, self-injury, sleep disturbance and suicidal ideas. The patient is not nervous/anxious.     Immunization History  Administered Date(s) Administered   Hepatitis B 02/18/2008, 11/10/2008, 04/22/2009   Hepb-cpg 02/06/2019, 04/08/2019   Influenza Split 01/23/2011, 02/29/2012   Influenza Whole 02/18/2008, 04/22/2009, 11/23/2009   Influenza, Seasonal, Injecte, Preservative Fre 02/27/2023, 12/11/2023   Influenza,inj,Quad PF,6+ Mos 01/20/2014, 02/09/2015, 01/01/2017, 01/28/2018, 11/26/2018, 03/30/2020, 03/07/2021, 03/07/2022   Meningococcal Mcv4o 07/23/2018, 09/23/2018   PFIZER(Purple Top)SARS-COV-2 Vaccination 11/01/2019, 12/01/2019   PNEUMOCOCCAL CONJUGATE-20 03/07/2022   Pfizer Covid-19 Vaccine Bivalent Booster 47yrs & up 03/07/2021   Pfizer(Comirnaty)Fall Seasonal Vaccine 12 years and older 03/07/2022, 02/27/2023   Pneumococcal Conjugate-13 10/22/2014   Pneumococcal Polysaccharide-23 11/15/2007, 09/17/2012   Tdap 02/09/2015   Unspecified  SARS-COV-2 Vaccination 12/11/2023   Pertinent  Health Maintenance Due  Topic Date Due   Colonoscopy  08/20/2024 (Originally 05/05/2010)   Influenza Vaccine  Completed      03/07/2022    9:05 AM 02/27/2023   10:26 AM 08/21/2023    9:55 AM 12/11/2023    8:57 AM 02/20/2024    8:58 AM  Fall Risk  Falls in the past year? 0 0 0 0 0  Was there an injury with Fall? 0  0  0   0  Fall Risk Category Calculator 0 0 0  0  Fall Risk Category (Retired) Low       Patient at Risk for Falls Due to  No Fall Risks No Fall Risks  No Fall Risks  Fall risk Follow up  Falls evaluation completed Falls evaluation completed  Falls evaluation completed     Data saved with a previous flowsheet row definition   Functional Status Survey:    Vitals:   02/20/24 0900  BP: 136/84  Pulse: 73  Temp: 98.8 F (37.1 C)  SpO2: 97%  Weight: 138 lb 12.8 oz (63 kg)  Height: 5' 11 (1.803 m)   Body mass index is 19.36 kg/m. Physical Exam VITALS: P- 73, BP- 136/84, SaO2- 97% MEASUREMENTS: Weight- 138.8. GENERAL: Alert, cooperative, well developed, no acute distress HEENT: Normocephalic, normal oropharynx, moist mucous membranes, ears normal, right nasal congestion, no sinus tenderness CHEST: Clear to auscultation bilaterally, no wheezes, rhonchi, or crackles CARDIOVASCULAR: Normal heart rate and rhythm, S1 and S2 normal without murmurs ABDOMEN: Soft, non-tender, non-distended, without organomegaly,  normal bowel sounds EXTREMITIES: No cyanosis or edema, no extremity swelling MUSCULOSKELETAL: No leg pain, no leg pain on movement NEUROLOGICAL: Cranial nerves grossly intact, moves all extremities without gross motor or sensory deficit  SKIN: No rash,no lesion or erythema   PSYCHIATRY/BEHAVIORAL: Mood stable   Labs reviewed: Recent Labs    02/27/23 1054 08/21/23 1053 11/27/23 0850  NA 138 140 138  K 4.3 4.2 4.0  CL 107 108 105  CO2 23 26 25   GLUCOSE 72 89 104*  BUN 11 15 12   CREATININE 1.08 1.31* 1.07   CALCIUM  9.5 9.5 9.5  PHOS 3.8  --   --    Recent Labs    08/21/23 1053 11/27/23 0850  AST 20 18  ALT 17 18  BILITOT 0.3 0.3  PROT 7.1 7.1   Recent Labs    08/21/23 1053 11/02/23 0908 11/27/23 0850  WBC 5.5 6.7 5.9  NEUTROABS 2,497  --  2,413  HGB 14.5 14.6 15.3  HCT 47.0 48.1 48.7  MCV 80.8 80.4 80.0  PLT 254 255 270   Lab Results  Component Value Date   TSH 2.15 08/21/2023   Lab Results  Component Value Date   HGBA1C 5.7 (A) 11/26/2018   Lab Results  Component Value Date   CHOL 189 08/21/2023   HDL 54 08/21/2023   LDLCALC 115 (H) 08/21/2023   TRIG 102 08/21/2023   CHOLHDL 3.5 08/21/2023    Significant Diagnostic Results in last 30 days:  No results found.  Assessment/Plan  Mixed hyperlipidemia Cholesterol levels have improved over the past year, with total cholesterol decreasing from 209 to 189 mg/dL and triglycerides from 165 mg/dL. LDL cholesterol remains slightly elevated at 115 mg/dL, down from 873 mg/dL six months ago. - Advised dietary modifications to reduce intake of fried foods and saturated fats. - Encouraged continued exercise and inclusion of vegetables in diet.  Prediabetes Previous A1c indicated prediabetes. Recent glucose levels were elevated despite fasting, but diabetes has been ruled out by previous evaluations. - Ordered A1c test to reassess glucose control.  Tobacco use disorder Currently smoking less than half a pack per day and has expressed a desire to quit smoking as a new year's resolution. - Encouraged continued reduction in smoking. - Offered support for smoking cessation if needed.  General health maintenance Routine health maintenance discussed, including exercise and dietary habits. No issues with anxiety, depression, bowel movements, or falls reported. - Continue regular exercise and healthy diet. - Schedule follow-up appointment in six months.   Family/ staff Communication: Reviewed plan of care with patient  verbalized understanding   Labs/tests ordered:  Hgb A1C   Next Appointment : Return in about 6 months (around 08/20/2024) for medical mangement of chronic issues.SABRA   Spent 30 minutes of Face to face and non-face to face with patient  >50% time spent counseling; reviewing medical record; tests; labs; documentation and developing future plan of care.   Roxan JAYSON Plough, NP

## 2024-02-21 ENCOUNTER — Ambulatory Visit: Payer: Self-pay | Admitting: Family

## 2024-08-20 ENCOUNTER — Ambulatory Visit: Admitting: Family

## 2024-09-09 ENCOUNTER — Ambulatory Visit: Admitting: Infectious Diseases
